# Patient Record
Sex: Male | Born: 1980 | Race: White | Hispanic: No | Marital: Married | Smoking: Never smoker
Health system: Western US, Academic
[De-identification: ages and names within clinical notes are randomized; demographics above are authoritative.]

## PROBLEM LIST (undated history)

## (undated) DIAGNOSIS — Z789 Other specified health status: Secondary | ICD-10-CM

## (undated) HISTORY — PX: ROTATOR CUFF REPAIR: SHX139

## (undated) HISTORY — DX: Other specified health status: Z78.9

---

## 2013-04-10 HISTORY — PX: WRIST SURGERY: SHX841

## 2016-09-08 ENCOUNTER — Encounter (INDEPENDENT_AMBULATORY_CARE_PROVIDER_SITE_OTHER): Payer: Self-pay | Admitting: Sports Medicine

## 2016-09-08 DIAGNOSIS — M25562 Pain in left knee: Principal | ICD-10-CM

## 2016-09-11 ENCOUNTER — Encounter (INDEPENDENT_AMBULATORY_CARE_PROVIDER_SITE_OTHER): Payer: Self-pay | Admitting: Sports Medicine

## 2016-09-11 ENCOUNTER — Ambulatory Visit (INDEPENDENT_AMBULATORY_CARE_PROVIDER_SITE_OTHER): Payer: TRICARE Prime—HMO | Admitting: Sports Medicine

## 2016-09-11 VITALS — BP 142/81 | HR 87 | Temp 98.4°F

## 2016-09-11 DIAGNOSIS — M25562 Pain in left knee: Secondary | ICD-10-CM

## 2016-09-11 DIAGNOSIS — M7652 Patellar tendinitis, left knee: Secondary | ICD-10-CM

## 2016-09-11 NOTE — Progress Notes (Signed)
[facility] DEPARTMENT OF ORTHOPAEDIC SURGERY, SPORTS MEDICINE    PRIMARY CARE PROVIDER:   Gabrielle Dare or REFERRING PHYSICIAN:   Concepcion Living, DO  [address]  [place], [place] [address]    REASON FOR VISIT: Left knee pain    HISTORY: Cameron Ray is a 36 year old male who presents today for evaluation of knee pain, left.  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     He started having left anterior knee pain for the past 6 months.   Pain is located at the inferior pole of patella  He doesn't have any other pain.  After seeing his flight surgeon, he has tried physical therapy 12 wks x 2 and has not improved.   Initially pain was mild but now had gotten to point that interferes with sleep and also he is unable to do any climbing stairs or maximal knee flexion.     He had an MRI 09/08/16, showing lateral patellar facet fissuring and also patellar tendinopathy.     He had normally been an avid runner and has cut back on his activity due to the knee pain.     Past Medical History:   Diagnosis Date    Medical history non-contributory        Past Surgical History:   Procedure Laterality Date    ROTATOR CUFF REPAIR Left 2009, 2012    x 2    WRIST SURGERY Right 2015       No current outpatient prescriptions on file.     No current facility-administered medications for this visit.        Social History     Social History    Marital status: Married     Spouse name: N/A    Number of children: N/A    Years of education: N/A     Occupational History    Not on file.     Social History Main Topics    Smoking status: Never Smoker    Smokeless tobacco: Never Used    Alcohol use Not on file    Drug use: Not on file    Sexual activity: Not on file     Other Topics Concern    Not on file     Social History Narrative    No narrative on file       family history includes Cancer in his other; Diabetes in his other; Stroke in his other.    Allergies not on file    Review of Systems: per Intake  Sheet    PHYSICAL EXAM:  VITALS: BP 142/81   Pulse 87   Temp 98.4 F (36.9 C) (Oral)  GENERAL: A+Ox3. INAD. Well appearing and well groomed. Normal gait  MENTAL STATUS: Pleasant and cooperative. Alert and oriented x3 with normal mood and affect.  Vascular: Pulses are palpable 2+, capillary refills to digits are normal.  Skin: No wounds/lesions/ulcers. Skin warm & dry.  Respiratory: No respiratory distress    MUSCULOSKELETAL:    Left Knee:   Skin: intact  Effusion: none  Range of Motion: 0-140 degrees.  Tenderness: Medial Joint Line: no ;Lateral Joint Line: no. TTP Proximal lateral patellar tendon at inferior pole of patella  Stable to Varus/Valgus stress.    Lachman: Normal.    Posterior Drawer: Normal.   McMurray exam: Negative for pain, negative for click.   Patellar Grind: negative  Patellar Stability: moderate hypermobility  Normal Straight leg  raise without quad atophy  Mildly positive J sign    Muscle Strength: R/L 5/5 in the anterior, posterior, lateral group muscles.  Compartments: are soft, no proximal calf tenderness.   SILT saphenous/sural/superficial peroneal/deep peroneal nerves      ==========    IMAGING FINDINGS: I reviewed the patient's imaging today.  MRI reviewed today in clinic, showing Tendinopathy of proximal patellar tendon on lateral portion. There is some fissuring of lateral patellar facet, otherwise normal MRI left knee.     ==========    ASSESSMENT & PLAN: Cameron PriestJeremy Lee Ray is a 36 year old male:      ICD-10-CM ICD-9-CM    1. Left knee pain, unspecified chronicity M25.562 719.46    2. Patellar tendinitis, left knee M76.52 726.64      Patient presents with recalcitrant anterior knee pain following total of 24 sessions of physical therapy since 6 months ago.    We discussed the findings in detail today as well as treatment options. The patient has had quite a bit of physical therapy already.  I am not sure that this is really making a difference at this point. 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  The other possibility would be consideration of a surgical debridement and repair of the patellar tendon.  Usually would not do this without exhausting all nonsurgical options. Since he is active duty [employer], he is going to go back and talk to his regular doctor to see if they would cover the platelet rich plasma.  He will get back to us.  I told him that if [facility] had the [facility], that would also be reasonable.  He is agreeable to this plan and all questions were answered.    -Patient was educated on clinical and imaging findings.  -Patient verbalizes understanding of all discussions today, and all questions were answered satisfactorily.    I saw the patient, performed a physical exam, and developed the treatment plan.    I edited the note and agree with the content as written.    See PA/resident note for full detail.

## 2016-09-12 ENCOUNTER — Other Ambulatory Visit: Payer: Self-pay

## 2016-09-12 ENCOUNTER — Telehealth (INDEPENDENT_AMBULATORY_CARE_PROVIDER_SITE_OTHER): Payer: Self-pay | Admitting: Sports Medicine

## 2016-09-12 NOTE — Telephone Encounter (Signed)
Patient is calling to request his OV notes from yesterday be faxed to him [phone]. Patient states this is his personal fax for his records. Patient states he was advised to call this morning as notes should be completed/signed.

## 2016-09-12 NOTE — Telephone Encounter (Signed)
Cameron Ray  requested callback for advice on:  Pt is calling in regards to the PRP injections that were offered bu Dr. Merilynn Finlandobertson and he stated Dr wanted him to call his insurance to see if they cover the injections bu they stated that we need to call them and see if it will be covered , please advice Pt if he can get these injections covered and authorized by his [employer] insurance .       Treating Physician:Dr. Merilynn Finlandobertson       LOV notes/plan: 09/11/2016  ASSESSMENT & PLAN: Cameron PriestJeremy Lee Bogert is a 36 year old male:      ICD-10-CM ICD-9-CM    1. Left knee pain, unspecified chronicity M25.562 719.46    2. Patellar tendinitis, left knee M76.52 726.64      Patient presents with recalcitrant anterior knee pain following total of 24 sessions of physical therapy since 6 months ago.    Presentation c/w recalcitrant proximal patellar tendinopathy. Currently symptoms are not due to intra-articular pathology.   -patient is a good candidate for PRP injection onto the diseased portion of the patellar tendon to help with recovery. patient will have to check if this will be covered for his treatment  -physical therapy can be tailored to focus on eccentric strengthening of patellar tendon.   -surgical debridement is also last option as treatment modality.       -Patient was educated on clinical and imaging findings.  -Patient verbalizes understanding of all discussions today, and all questions were answered satisfactorily.    Pennelope BrackenUziel Isidro Sauceda MD    Primary care Sports Medicine Fellow     Patient seen and discussed with Attending Dr. Domenic Morasobertson          [facility] number to be reached at:    [phone]        Route to RN pool if requesting clinical advice, route to MA if administrative advice

## 2016-09-13 NOTE — Telephone Encounter (Signed)
LVM for pt.   Pt should be advised to sign up for mychart access, he can make medical records requests through [facility] or at the medical records office.   Last chart not is not yet signed by Dr Merilynn Finlandobertson.     In regard to PRP, this is very rarely, if ever, a cover service. [facility] does not call pt insurance's for prior authorizations as this is not a covered service. We offer PRP as a cash pay service, current rate $600. Pt could opt to submit his own paperwork for potentially trying to get reimbursement from his insurance. I have not seen this done successfully either.  I suspect Dr Merilynn Finlandobertson was recommending he call [employer] simply as an inquiry for pt edification. PRP injections are not usually covered by any insurance.

## 2016-09-18 ENCOUNTER — Encounter (INDEPENDENT_AMBULATORY_CARE_PROVIDER_SITE_OTHER): Payer: Self-pay | Admitting: Sports Medicine

## 2016-09-19 ENCOUNTER — Telehealth (INDEPENDENT_AMBULATORY_CARE_PROVIDER_SITE_OTHER): Payer: Self-pay | Admitting: Sports Medicine

## 2016-09-19 NOTE — Telephone Encounter (Signed)
Pt will like to know what billing  codes will Dr. Merilynn Finlandobertson use to do PRP to his L knee. Pt is trying to see if he can obtain authorization through his insurance. Please advise pt.

## 2016-09-19 NOTE — Telephone Encounter (Signed)
CPT Code: 0232T     Pt informed.

## 2016-10-16 ENCOUNTER — Telehealth (INDEPENDENT_AMBULATORY_CARE_PROVIDER_SITE_OTHER): Payer: Self-pay | Admitting: Sports Medicine

## 2016-10-16 NOTE — Telephone Encounter (Addendum)
Patient is calling back states he would like to pay oop for the PRP injection need to know exact amount.     Please confirm ok to schedule as CASH.

## 2016-10-16 NOTE — Telephone Encounter (Signed)
Routed to [place] to call pt and schedule

## 2016-10-24 ENCOUNTER — Encounter (INDEPENDENT_AMBULATORY_CARE_PROVIDER_SITE_OTHER): Payer: Self-pay | Admitting: Sports Medicine

## 2016-10-27 ENCOUNTER — Encounter (INDEPENDENT_AMBULATORY_CARE_PROVIDER_SITE_OTHER): Payer: TRICARE Prime—HMO | Admitting: Sports Medicine

## 2016-12-08 ENCOUNTER — Ambulatory Visit (INDEPENDENT_AMBULATORY_CARE_PROVIDER_SITE_OTHER): Payer: Self-pay | Admitting: Sports Medicine

## 2016-12-08 ENCOUNTER — Encounter (INDEPENDENT_AMBULATORY_CARE_PROVIDER_SITE_OTHER): Payer: Self-pay | Admitting: Sports Medicine

## 2016-12-08 VITALS — BP 115/76 | HR 84 | Temp 98.2°F

## 2016-12-08 DIAGNOSIS — M7652 Patellar tendinitis, left knee: Principal | ICD-10-CM

## 2016-12-08 DIAGNOSIS — Z09 Encounter for follow-up examination after completed treatment for conditions other than malignant neoplasm: Secondary | ICD-10-CM

## 2016-12-08 MED ORDER — HYDROCODONE-ACETAMINOPHEN 5-325 MG OR TABS
1.0000 | ORAL_TABLET | Freq: Four times a day (QID) | ORAL | 0 refills | Status: DC | PRN
Start: 2016-12-08 — End: 2017-02-09

## 2016-12-08 NOTE — Progress Notes (Signed)
Reason for visit: Platelet rich plasma injection.    Consent was obtained from the patient.    120 cc of blood was drawn from the arm in sterile fashion. This [facility] cent[facility]uge using the 5% protocol.    The affected area was then prepped in normal sterile fashion. 3cc 1% lidocain given in the skin.  The PRP was then injected into the left patellar tendon using ultrasound guidance.  Images saved.    The patient tolerated the procedure well.    Postprocedure precautions were given.    The patient will give us an update in 6 weeks.      Patient instructions provided today in clinic:    Post PRP activity restrictions:    No exercise for 2 days after PRP injection    1) First 2 days- normal activity, no exercise    2) First 2 weeks- 50% activity, low impact exercise OK    3) Week 3: 75% activity    4) Week 4: normal activity- ok for weights but start light    No NSAIDs x 1 week      I saw the patient, performed a physical exam, and developed the treatment plan.    I edited the note and agree with the content as written.    See PA/resident note for full detail.

## 2016-12-08 NOTE — Patient Instructions (Signed)
Post PRP activity restrictions:    No exercise for 2 days after PRP injection    1) First 2 days- normal activity, no exercise    2) First 2 weeks- 50% activity, low impact exercise OK    3) Week 3: 75% activity    4) Week 4: normal activity- ok for weights but start light    No NSAIDs x 1 week

## 2017-01-16 ENCOUNTER — Telehealth (INDEPENDENT_AMBULATORY_CARE_PROVIDER_SITE_OTHER): Payer: Self-pay | Admitting: Sports Medicine

## 2017-01-16 NOTE — Telephone Encounter (Signed)
Patient states he was told to call in 6 weeks after his PRP to relay results. Per patient he felt improvement but "doesn't feel that great, I can't go run a marathon or anything".  Patient is wondering if Dr. Merilynn Finland has a recommendation for the next step.

## 2017-01-19 NOTE — Telephone Encounter (Signed)
The patient is now 6 weeks post PRP.  He is a bit better now than prior to injection.  He is doing PT.    He is better but not fully resolved.  Discussed options.    WIll recheck in 2-3 weeks.

## 2017-02-06 ENCOUNTER — Ambulatory Visit (INDEPENDENT_AMBULATORY_CARE_PROVIDER_SITE_OTHER): Payer: TRICARE Prime—HMO | Admitting: Sports Medicine

## 2017-02-06 VITALS — BP 121/79 | HR 93 | Temp 98.2°F | Ht 68.0 in | Wt 173.0 lb

## 2017-02-06 DIAGNOSIS — M7652 Patellar tendinitis, left knee: Principal | ICD-10-CM

## 2017-02-06 NOTE — Progress Notes (Signed)
[facility] DEPARTMENT OF ORTHOPAEDIC SURGERY, SPORTS MEDICINE    PRIMARY CARE PROVIDER:   Gabrielle Dare or REFERRING PHYSICIAN:   Concepcion Living, DO  [address]  [place], [place] [address]    REASON FOR VISIT: Left knee pain    HISTORY: Cameron Ray is a 36 year old male who presents today for evaluation of knee pain, left.  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     He has been under treatment for left knee lateral patellar tendinitis.  He underwent a PRP injection on 08/31.  He has had moderate improvement of symptoms since that time.  He notes that he has more good days than bad but is still not able to run.  He is no longer getting pain that wakes him up from sleep.  He has less tenderness in the area.  He has worked with a new physical therapist which she is also felt was very helpful.  He is working on strengthening his VMO and also on some lateral maltracking.      Past Medical History:   Diagnosis Date    Medical history non-contributory        Past Surgical History:   Procedure Laterality Date    ROTATOR CUFF REPAIR Left 2009, 2012    x 2    WRIST SURGERY Right 2015       Current Outpatient Prescriptions   Medication Sig    HYDROcodone-acetaminophen (NORCO) 5-325 MG tablet Take 1 tablet by mouth every 6 hours as needed for Moderate Pain (Pain Score 4-6).     No current facility-administered medications for this visit.        Social History     Social History    Marital status: Married     Spouse name: N/A    Number of children: N/A    Years of education: N/A     Occupational History    Not on file.     Social History Main Topics    Smoking status: Never Smoker    Smokeless tobacco: Never Used    Alcohol use Not on file    Drug use: Not on file    Sexual activity: Yes     Partners: Female     Other Topics Concern    Not on file     Social History Narrative    Active duty Curator for [employer]       family history includes Cancer in his other; Diabetes in  his other; Stroke in his other.    No Known Allergies    Review of Systems: per Intake Sheet    PHYSICAL EXAM:  VITALS: BP 121/79   Pulse 93   Temp 98.2 F (36.8 C)   Ht 5\' 8"  (1.727 m)   Wt 78.5 kg (173 lb)   BMI 26.3 kg/m2  GENERAL: A+Ox3. INAD. Well appearing and well groomed. Normal gait  MENTAL STATUS: Pleasant and cooperative. Alert and oriented x3 with normal mood and affect.  Vascular: Pulses are palpable 2+, capillary refills to digits are normal.  Skin: No wounds/lesions/ulcers. Skin warm & dry.  Respiratory: No respiratory distress    MUSCULOSKELETAL:    Left Knee:   Skin: intact  Effusion: none  Range of Motion: 0-140 degrees.  Tenderness: Medial Joint Line: no ;Lateral Joint Line: Mild TTP Proximal lateral patellar tendon at inferior pole of patella  Stable to Varus/Valgus stress.    Lachman: Normal.  Posterior Drawer: Normal.   McMurray exam: Negative for pain, negative for click.   Patellar Grind: negative  Patellar Stability: moderate hypermobility  Normal Straight leg raise with mild VMO the quad atophy  Mildly positive J sign    Muscle Strength: R/L 5/5 in the anterior, posterior, lateral group muscles.  Compartments: are soft, no proximal calf tenderness.   SILT saphenous/sural/superficial peroneal/deep peroneal nerves      ==========    IMAGING FINDINGS: I reviewed the patient's imaging today.  MRI reviewed today in clinic, showing Tendinopathy of proximal patellar tendon on lateral portion. There is some fissuring of lateral patellar facet, otherwise normal MRI left knee.     A new ultrasound was obtained today with images saved.  He has improved parents of the patellar tendon laterally.    ==========    ASSESSMENT & PLAN: Cameron PriestJeremy Lee Ray is a 36 year old male presenting with left knee patellar tendinitis.  He is doing better since his PRP injection. I think overall his exam and symptoms are improved.  We discussed a couple of treatment options. He would like to proceed with the 2nd PRP  injection which we will plan to do this week.  He will also continue physical therapy.    -Patient was educated on clinical and imaging findings.  -Patient verbalizes understanding of all discussions today, and all questions were answered satisfactorily.

## 2017-02-06 NOTE — Patient Instructions (Signed)
PRP Friday midday or afternoon

## 2017-02-09 ENCOUNTER — Ambulatory Visit (INDEPENDENT_AMBULATORY_CARE_PROVIDER_SITE_OTHER): Payer: Self-pay | Admitting: Sports Medicine

## 2017-02-09 VITALS — BP 116/77 | HR 85 | Temp 98.6°F

## 2017-02-09 DIAGNOSIS — Z09 Encounter for follow-up examination after completed treatment for conditions other than malignant neoplasm: Secondary | ICD-10-CM

## 2017-02-09 DIAGNOSIS — M25569 Pain in unspecified knee: Principal | ICD-10-CM

## 2017-02-09 MED ORDER — HYDROCODONE-ACETAMINOPHEN 5-325 MG OR TABS
1.0000 | ORAL_TABLET | Freq: Four times a day (QID) | ORAL | 0 refills | Status: AC | PRN
Start: 2017-02-09 — End: ?

## 2017-02-10 NOTE — Progress Notes (Signed)
Reason for visit: Platelet rich plasma injection.    Consent was obtained from the patient.    120 cc of blood was drawn from the arm in sterile fashion.  This was loaded into the centrifuge on Angel 7% protocol.    The joint was then prepped in normal sterile fashion.  The PRP was then injected into the Left patellar tendon using ultrasound guidance.    The patient tolerated the procedure well.    Postprocedure precautions were given.    The patient will give us an update in 6 weeks.

## 2019-04-24 ENCOUNTER — Encounter: Payer: Self-pay | Admitting: Hospital

## 2019-07-13 ENCOUNTER — Encounter (INDEPENDENT_AMBULATORY_CARE_PROVIDER_SITE_OTHER): Payer: Self-pay

## 2021-05-10 IMAGING — MR MRI WRIST RT WO CONTRAST
6 series · 40 of 40 positions shown · IV contrast (gadolinium)
Comparison: None available.

HISTORY: Right wrist pain
TECHNIQUE: Multiplanar and multisequence MR imaging of the right wrist was performed without the administration of intravenous gadolinium.

[Series 4: t1_axial · axial · right · 3.0mm · 0.26mm/px · z∈[-55,+12]mm · 7 of 22 slices shown]
[im 1/22]
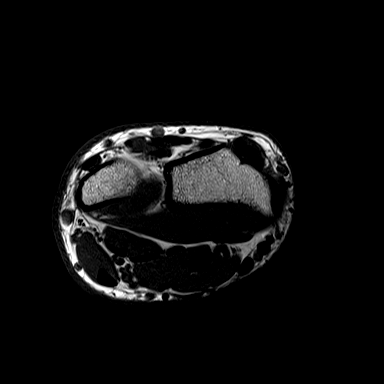
[im 4/22]
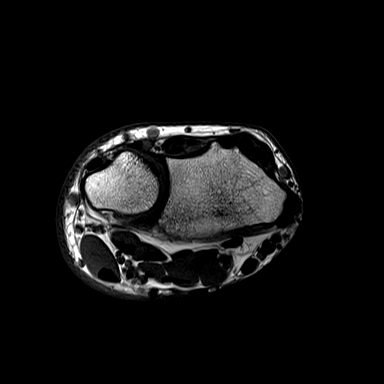
[im 8/22]
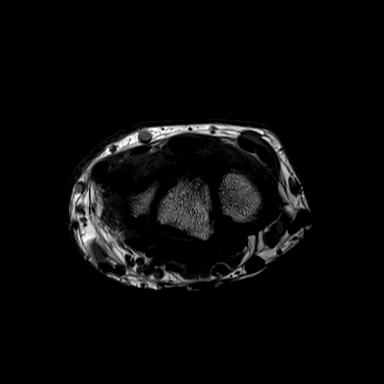
[im 11/22]
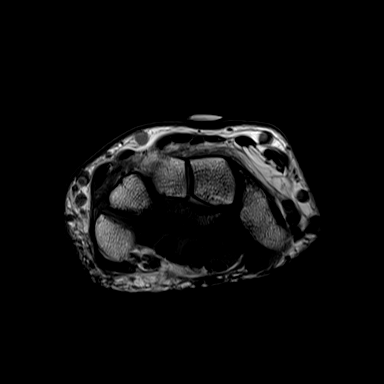
[im 15/22]
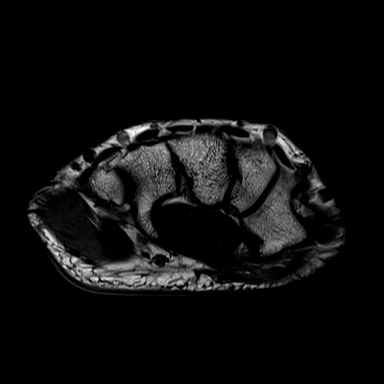
[im 18/22]
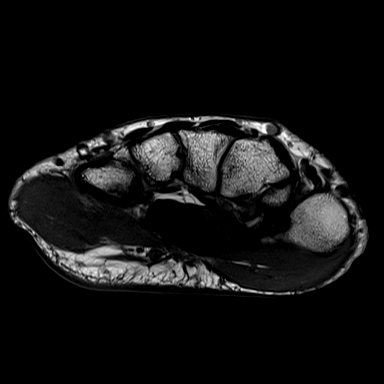
[im 22/22]
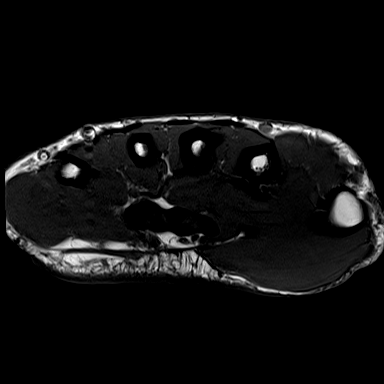

[Series 5: t2_axial_fs · axial · right · 3.0mm · 0.31mm/px · z∈[-55,+12]mm · 6 of 22 slices shown]
[im 1/22]
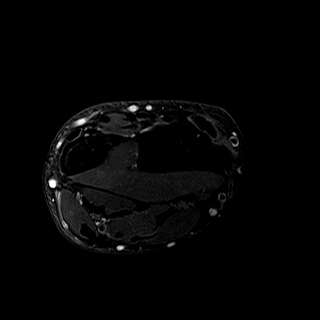
[im 5/22]
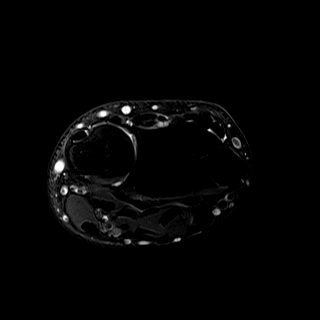
[im 9/22]
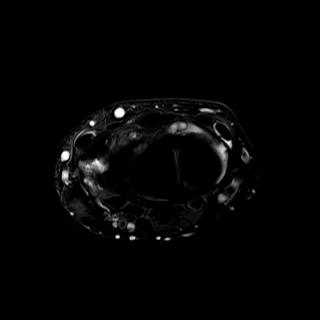
[im 13/22]
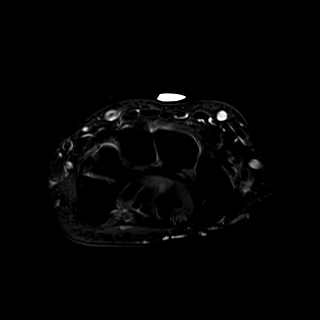
[im 17/22]
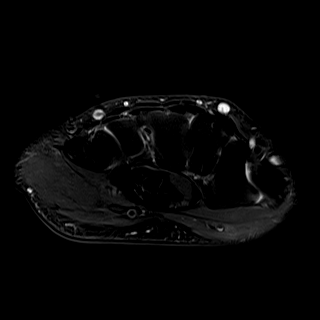
[im 22/22]
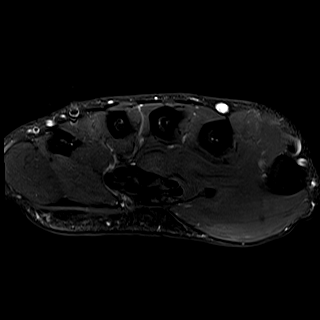

[Series 6: t1_cor · coronal · right · 3.0mm · 0.26mm/px · 4 of 14 slices shown]
[im 1/14]
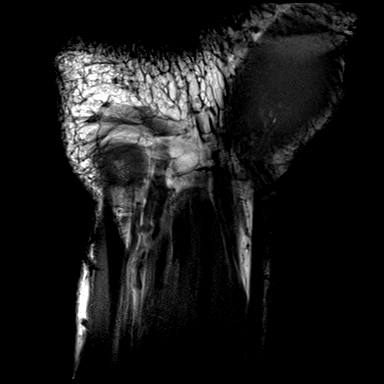
[im 5/14]
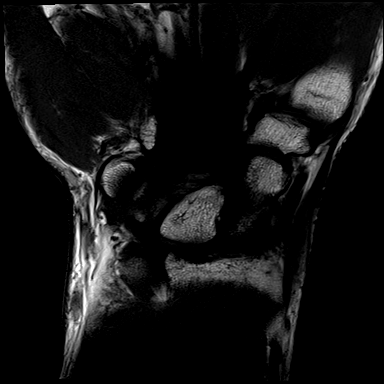
[im 9/14]
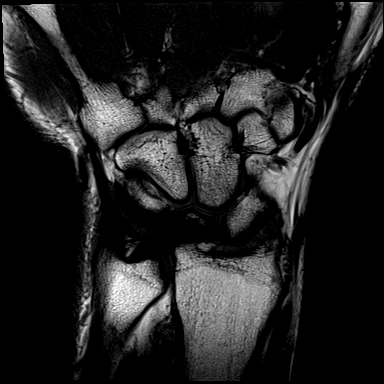
[im 14/14]
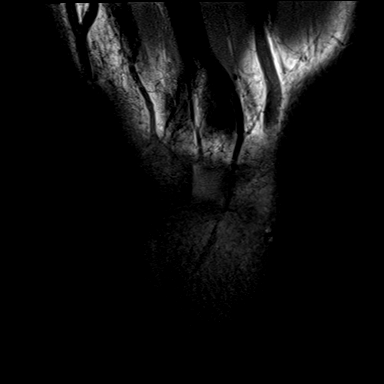

[Series 7: t2_cor_fs · coronal · right · 3.0mm · 0.31mm/px · 4 of 14 slices shown]
[im 1/14]
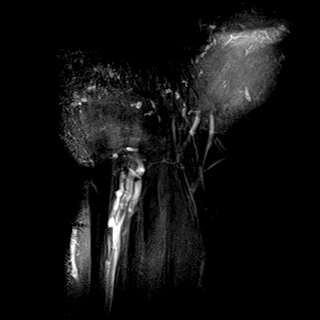
[im 5/14]
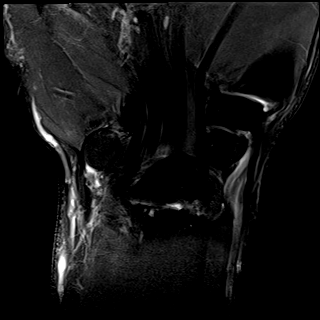
[im 9/14]
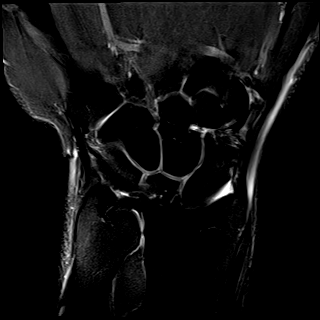
[im 14/14]
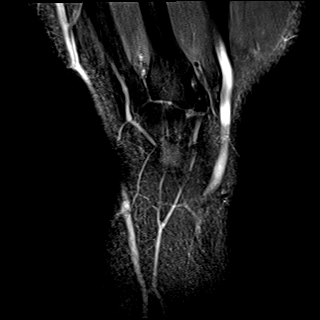

[Series 8: t2_cor_fs_space · coronal · right · 0.9mm · 0.33mm/px · 13 of 44 slices shown]
[im 1/44]
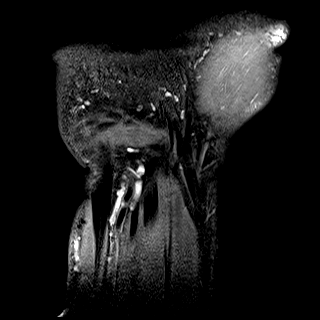
[im 4/44]
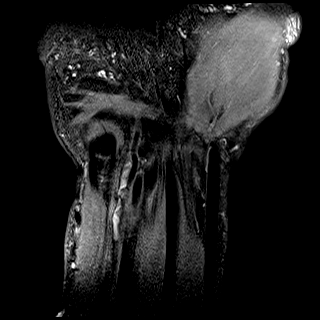
[im 8/44]
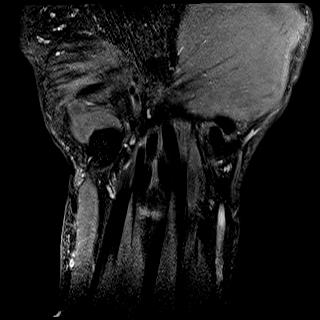
[im 11/44]
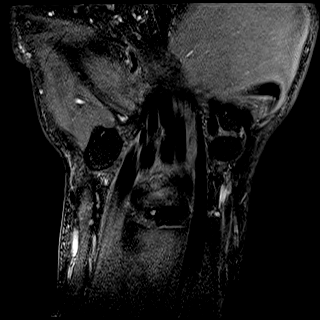
[im 15/44]
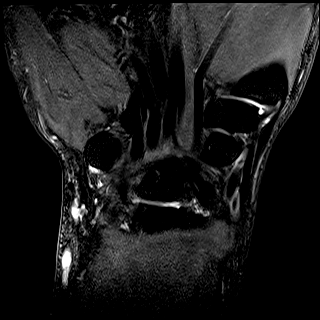
[im 18/44]
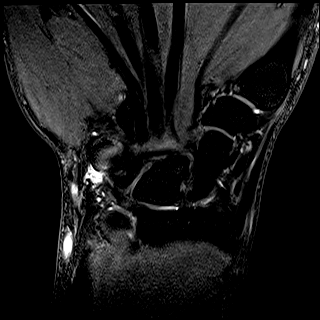
[im 22/44]
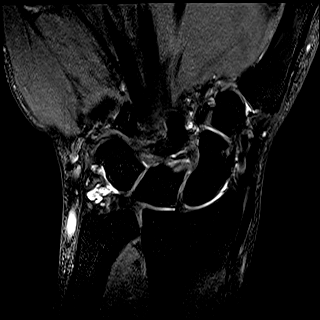
[im 26/44]
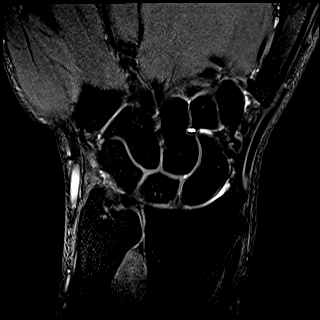
[im 29/44]
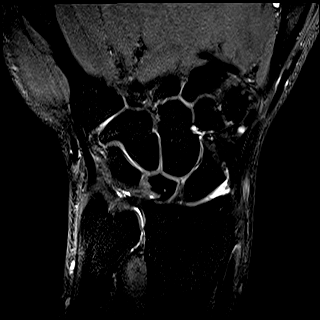
[im 33/44]
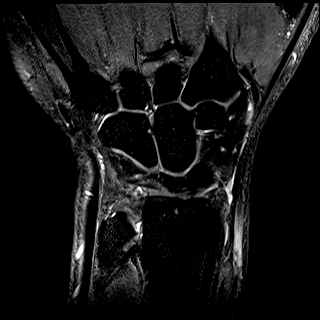
[im 36/44]
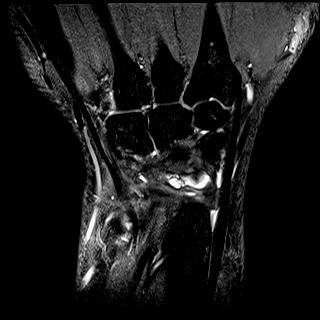
[im 40/44]
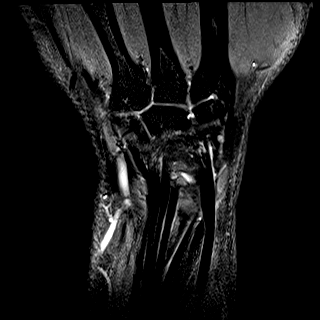
[im 44/44]
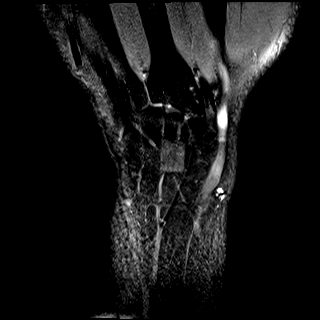

[Series 9: t2_sag_fs · sagittal · right · 3.0mm · 0.39mm/px · 6 of 20 slices shown]
[im 1/20]
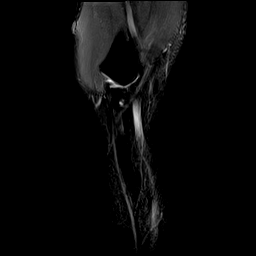
[im 4/20]
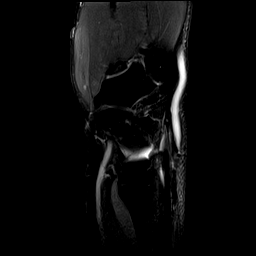
[im 8/20]
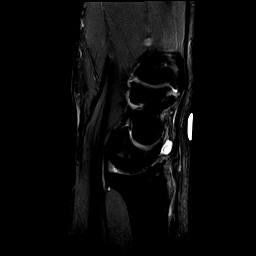
[im 12/20]
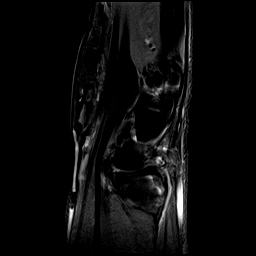
[im 16/20]
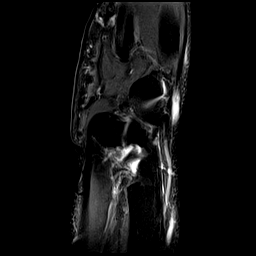
[im 20/20]
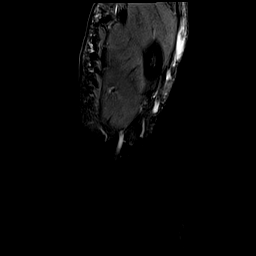

[40 of 40 positions shown; findings below may reference images not displayed]

FINDINGS: Osseous: No acute fracture, avascular necrosis or aggressive osseous lesion. Mild first CMC joint osteoarthritis. Mild radiocarpal joint osteoarthritis, with reactive subchondral marrow edema along the volar aspect of the distal radius at the radial lunate articulation.

Wrist ligaments: The scapholunate and lunotriquetral ligaments are intact. The volar and dorsal extrinsic wrist ligaments are grossly unremarkable.

Triangular fibrocartilage: Intact.

Tendons: The flexor and extensor tendons are intact. No tenosynovitis.

Other: A 15 x 6 mm ganglion cyst is noted along the volar aspect of the wrist. The median nerve is normal in size and signal intensity.
IMPRESSION: 1.
No acute fracture of the right wrist.

2.
Mild radiocarpal and first CMC joint osteoarthritis.

3.
15 x 6 mm ganglion cyst along the volar aspect of the wrist.

## 2021-12-06 IMAGING — RF ARTHROGRAM HIP LT
3 series · 3 of 3 positions shown · non-contrast
Comparison: Left hip MR arthrogram exam from the same day.

Images Obtained from [facility]
HISTORY: Joint derangement/degenerative changes of left hip.

[Series 1: AP · left · 0.15mm/px · 1 of 1 slices shown]
[im 1/1]
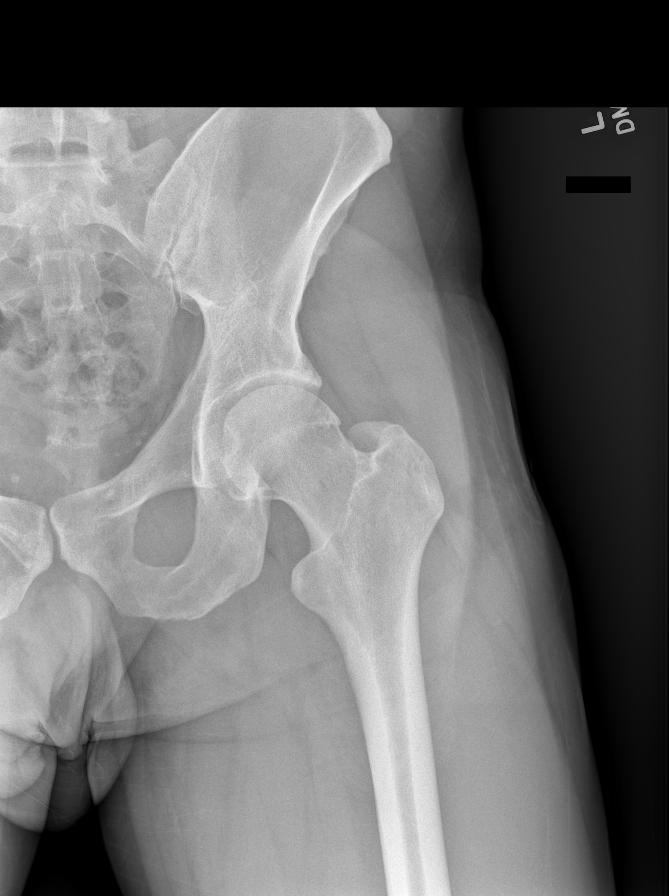

[Series 2: llo · left · 0.15mm/px · 1 of 1 slices shown]
[im 1/1]
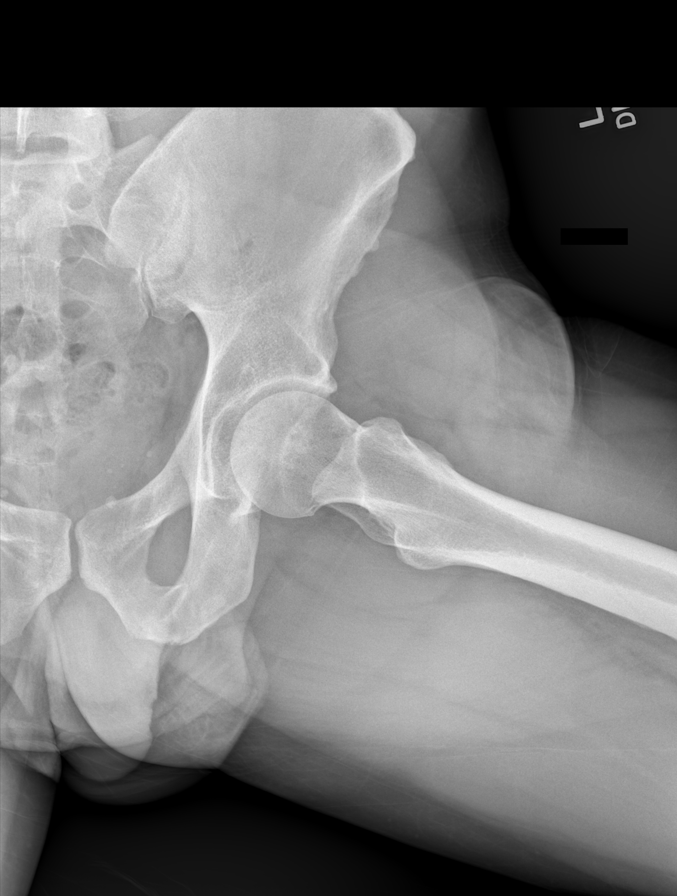

[Series 3: cp_standard · 0.27mm/px · 1 of 1 slices shown]
[im 1/1]
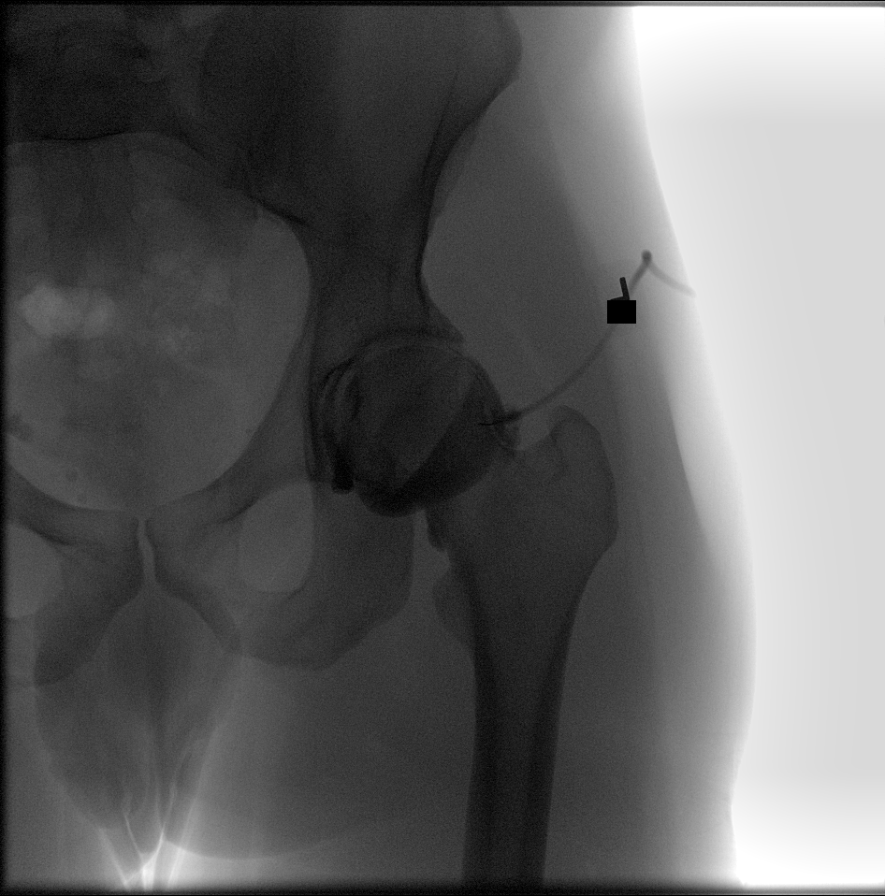

[3 of 3 positions shown; findings below may reference images not displayed]

PROCEDURE:  Left hip arthrogram with intra-articular injection of contrast under fluoroscopic guidance.
RADIATION DOSE:
Number of fluoroscopic images:  3
Fluoroscopy time: 13 seconds
Dose area product: 0.126 mGy*m2
Reference air kerma: 4.70 mGy
DESCRIPTION OF PROCEDURE:  Risks and benefits of left hip MR arthrogram evaluation discussed with the patient. Patient understands risks and benefits of procedure. Informed consent obtained. With
fluoroscopic guidance, sterile technique, local lidocaine anesthesia and 3.5-inch, 22-gauge spinal needle, left hip joint accessed with anterior approach with tip of needle placement, lateral aspect
of left femoral neck. After verification of needle placement with a small amount of contrast, 15 mL of contrast mixture made of 10 mL normal saline, 5 mL 1% lidocaine, 5 mL Omnipaque 240 and 0.1 mL
ProHance injected under fluoroscopic monitoring.
FINDINGS: Two scout images of left hip x-rays demonstrate mild to moderate degenerative changes of left hip joint. Mild degenerative changes of left SI joint and pubic symphysis are seen. No
fracture, dislocation or subluxation is identified. No obvious femoroacetabular impingement of left hip joint is seen. Multiple phleboliths of pelvis are seen bilaterally.
There is successful intra-articular injection of contrast.
IMPRESSION: 1.  Degenerative changes of left hip joint and pelvis as noted.
2.  No obvious femoroacetabular impingement is seen.
3.  There is successful intra-articular injection of dilute gadolinium contrast.
4.  Please also refer to same date left hip MR arthrogram evaluation.

## 2021-12-06 IMAGING — MR MRI HIP LT W CONTRAST
6 series · 40 of 40 positions shown · IV contrast (prohance)
Comparison: Left hip arthrogram 12/06/2021.

Images Obtained from [facility]
INDICATION: Joint derangement of left hip.
TECHNIQUE: Multiplanar, multiecho imaging of the left hip was performed  following 15 mL Prohance intravenous contrast, including T1-weighted and fluid-sensitive sequences.

[Series 2: t1_cor_obl_lt · coronal · left · 3.0mm · 0.56mm/px · 5 of 22 slices shown]
[im 1/22]
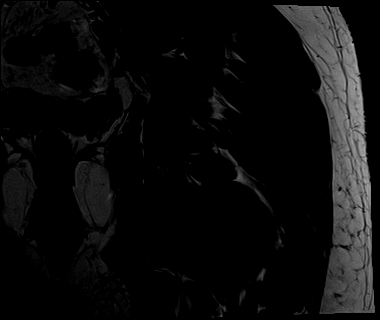
[im 6/22]
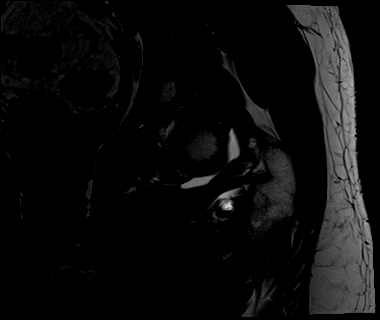
[im 11/22]
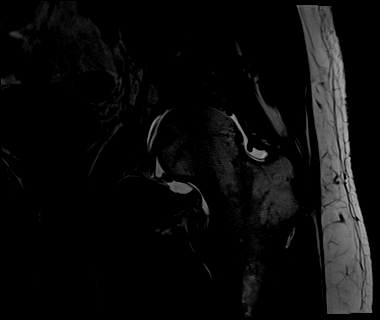
[im 16/22]
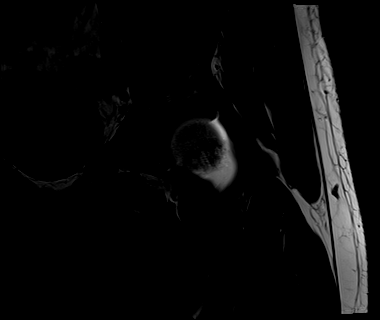
[im 22/22]
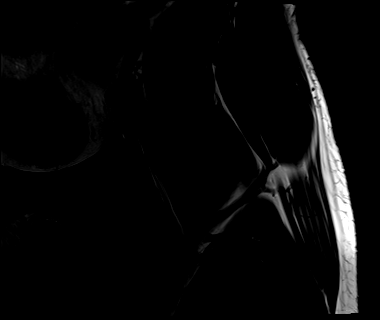

[Series 3: pd_cor_obl_fs_lt · coronal · left · 3.0mm · 0.56mm/px · 5 of 22 slices shown]
[im 1/22]
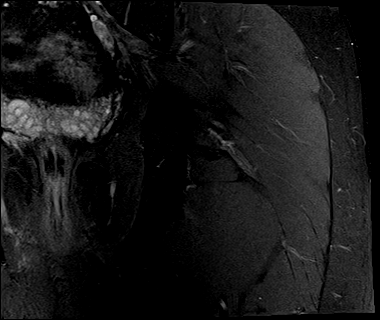
[im 6/22]
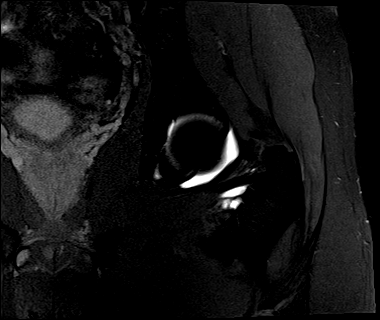
[im 11/22]
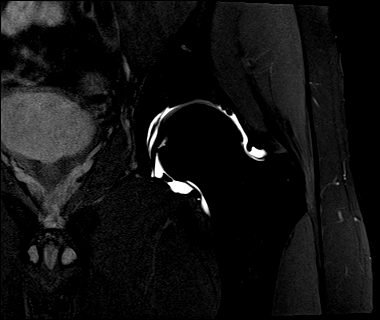
[im 16/22]
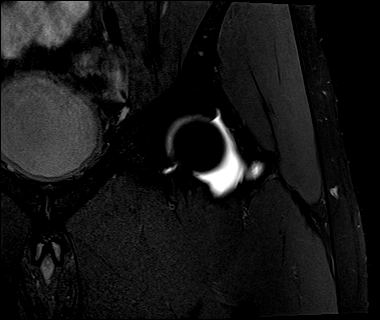
[im 22/22]
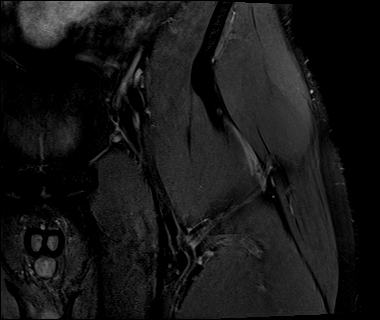

[Series 4: t1_cor_obl_fs_lt · coronal · left · 3.0mm · 0.56mm/px · 6 of 22 slices shown]
[im 1/22]
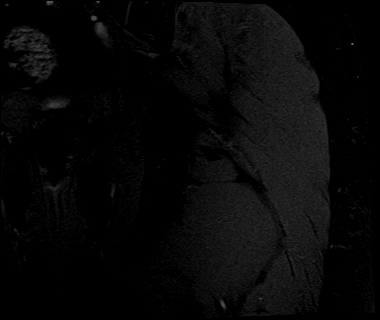
[im 5/22]
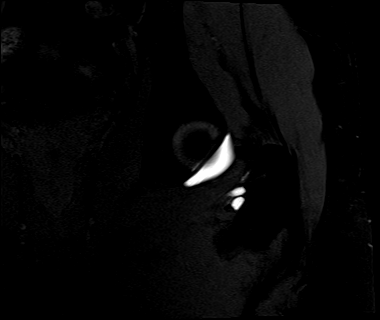
[im 9/22]
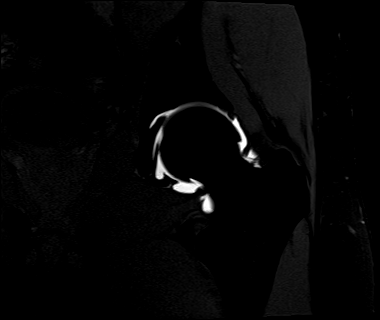
[im 13/22]
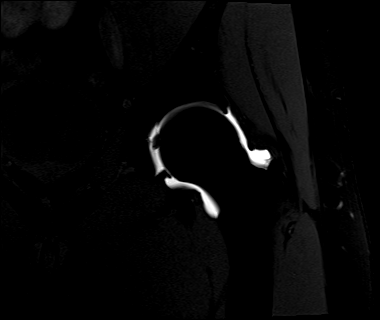
[im 17/22]
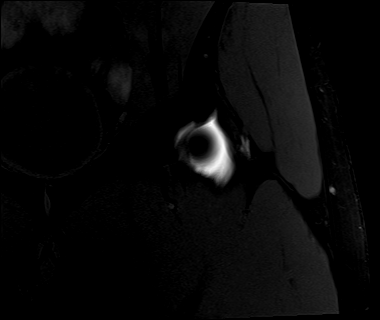
[im 22/22]
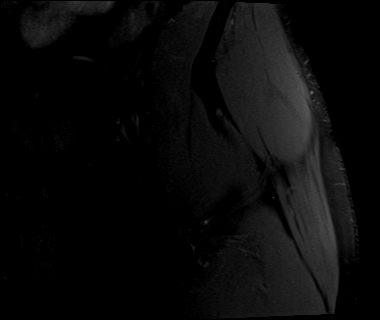

[Series 5: t2_axial_fs_ lt · axial · left · 4.0mm · 0.62mm/px · z∈[-118,+97]mm · 11 of 44 slices shown]
[im 1/44]
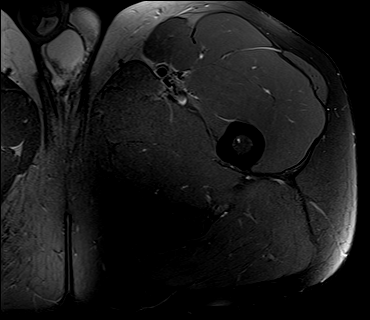
[im 5/44]
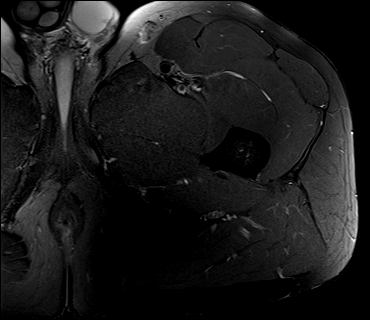
[im 9/44]
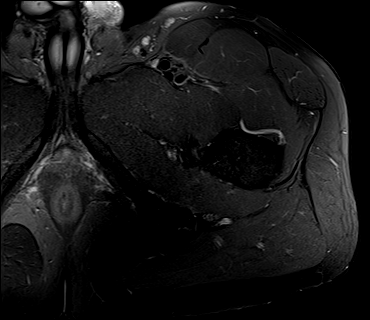
[im 13/44]
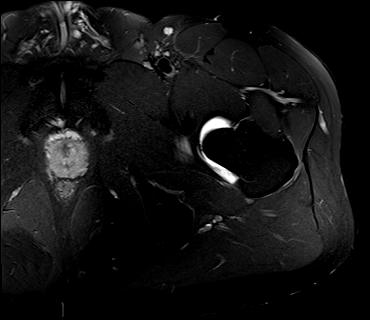
[im 18/44]
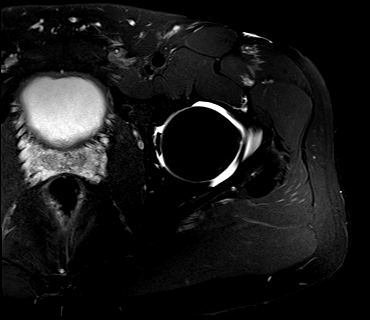
[im 22/44]
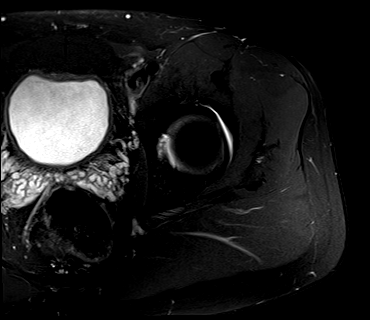
[im 26/44]
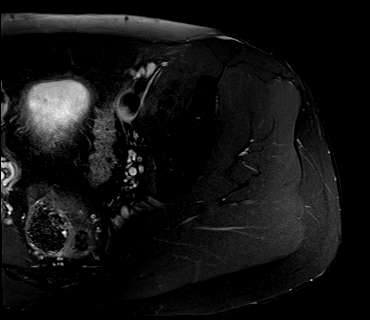
[im 31/44]
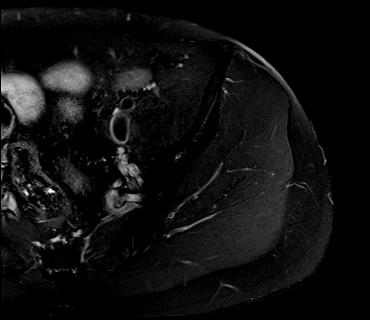
[im 35/44]
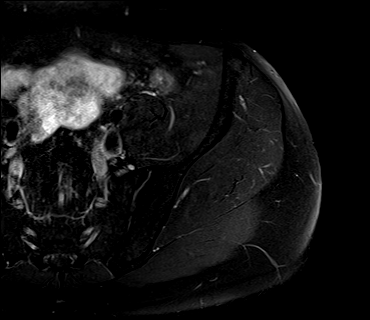
[im 39/44]
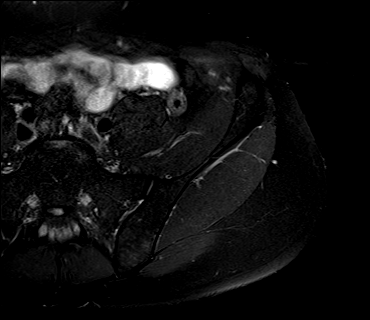
[im 44/44]
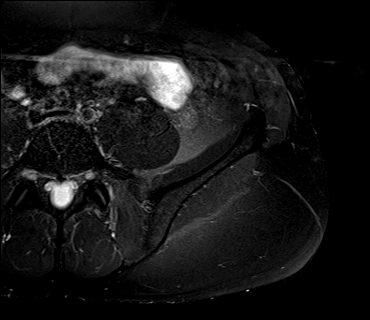

[Series 6: pd_axial_obl_fs_lt · oblique · left · 3.0mm · 0.56mm/px · 6 of 22 slices shown]
[im 1/22]
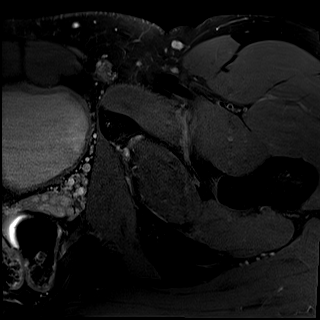
[im 5/22]
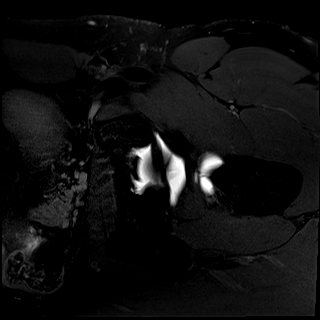
[im 9/22]
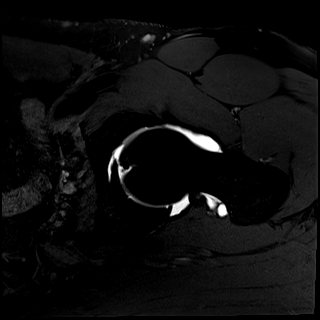
[im 13/22]
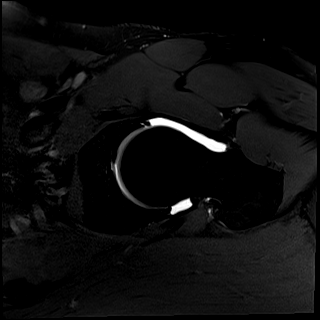
[im 17/22]
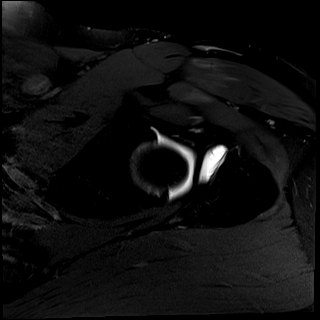
[im 22/22]
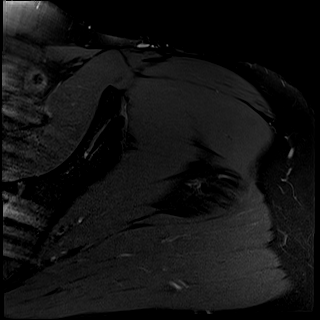

[Series 7: pd_sag_obl_fs_lt · sagittal · left · 3.0mm · 0.56mm/px · 7 of 28 slices shown]
[im 1/28]
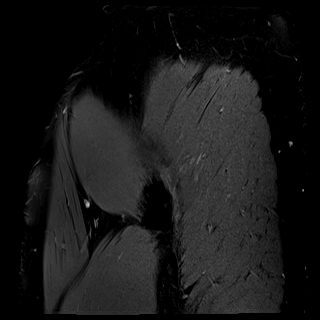
[im 5/28]
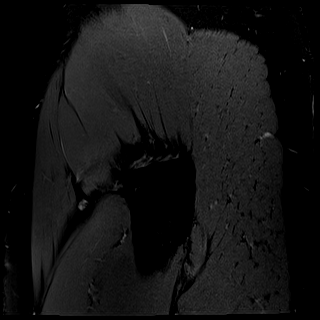
[im 10/28]
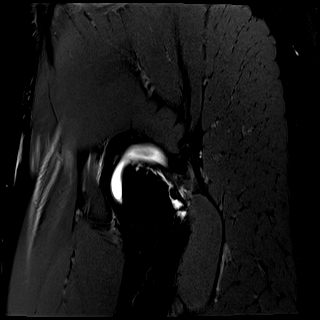
[im 14/28]
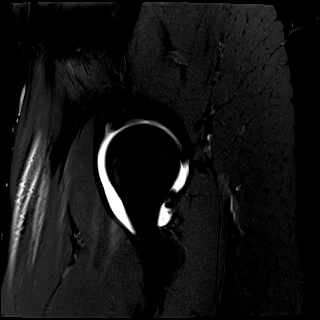
[im 19/28]
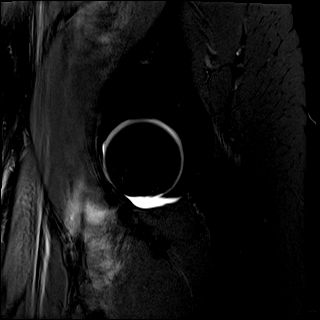
[im 23/28]
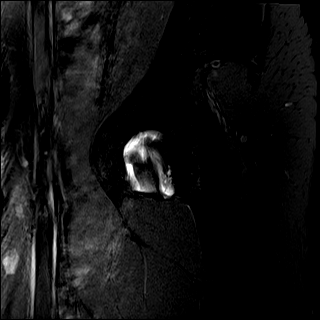
[im 28/28]
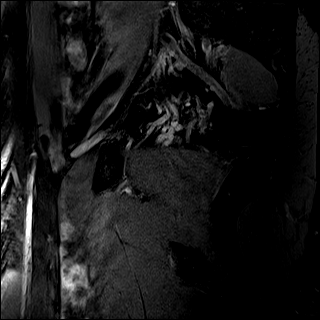

[40 of 40 positions shown; findings below may reference images not displayed]

FINDINGS: Marrow: No acute fracture. No AVN. No erosion.
Visualized SI joints and pubic symphysis: Normal.
Hip joints: No loose body. No chondral defects. Small tear of the anterior labrum. Cam morphology of the proximal left femur. Overcoverage of the femoral head by the acetabulum with a center edge
angle of 41 degrees. Ligamentum teres is intact.
Tendons: Normal.
Soft tissues: Normal.
No left inguinal adenopathy or hernia.
IMPRESSION: 1.  Small tear of the anterior left hip labrum.
2.  No left hip chondral defects.
3.  [place] morphology of the left hip.

## 2021-12-06 IMAGING — MR MRI KNEE LT WO CONTRAST
4 of 5 series · 19 of 40 positions shown · non-contrast
Comparison: None.

Images Obtained from [facility]
INDICATION: Chondromalacia patellae, left knee pain
TECHNIQUE: Multiplanar, multiecho imaging of the left knee was performed, including T1-weighted and fluid sensitive sequences without intravenous contrast administration.

[Series 8: t2_axial_fs · axial · left · 3.0mm · 0.28mm/px · z∈[-46,+76]mm · 8 of 32 slices shown]
[im 1/32]
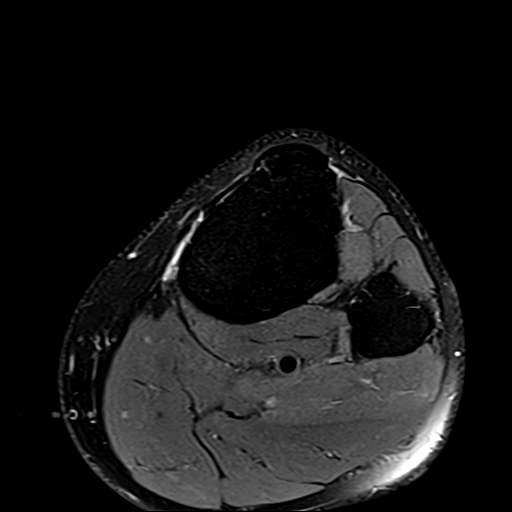
[im 5/32]
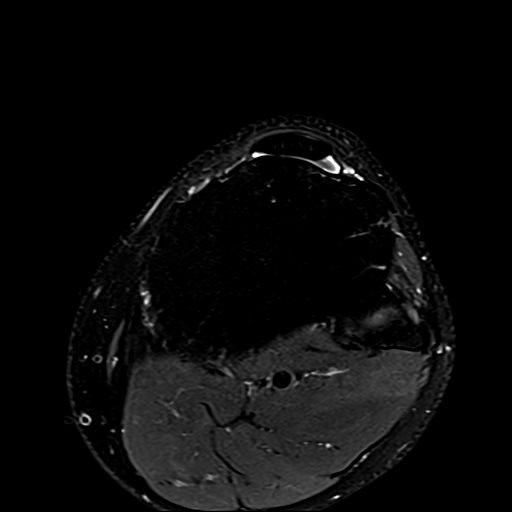
[im 9/32]
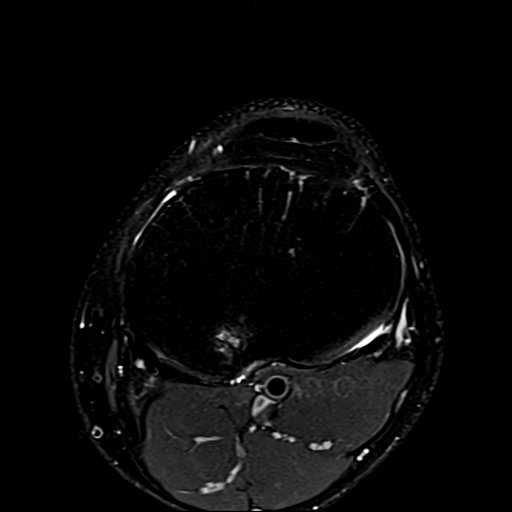
[im 14/32]
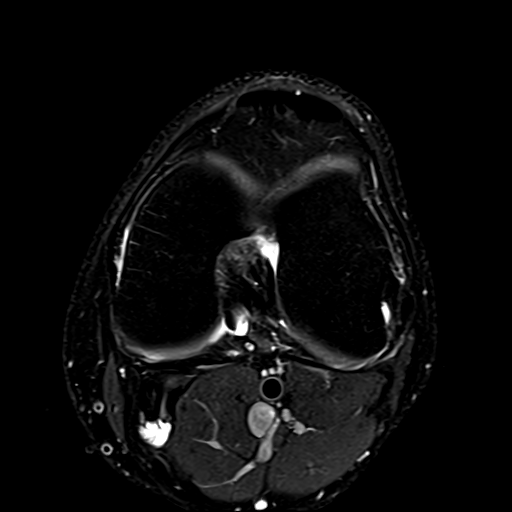
[im 18/32]
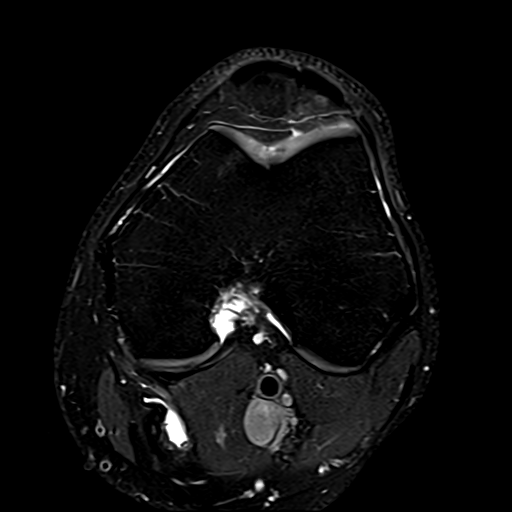
[im 23/32]
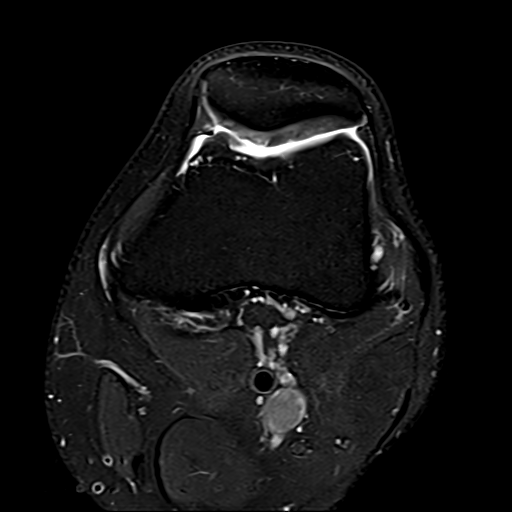
[im 27/32]
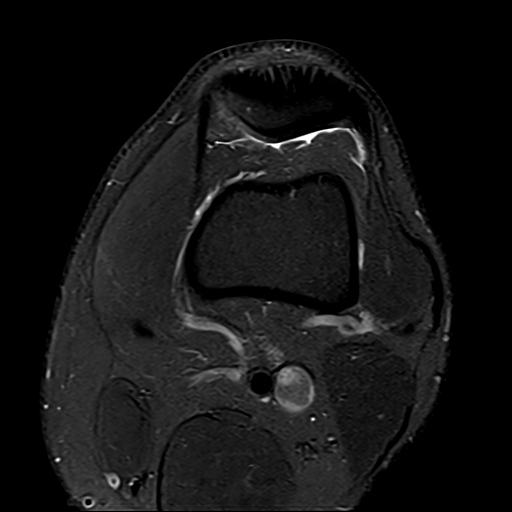
[im 32/32]
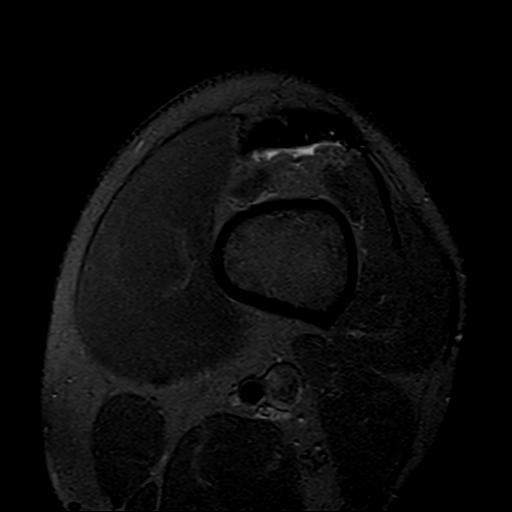

[Series 9: pd_sag_fs · sagittal · left · 3.0mm · 0.28mm/px · 5 of 26 slices shown]
[im 1/26]
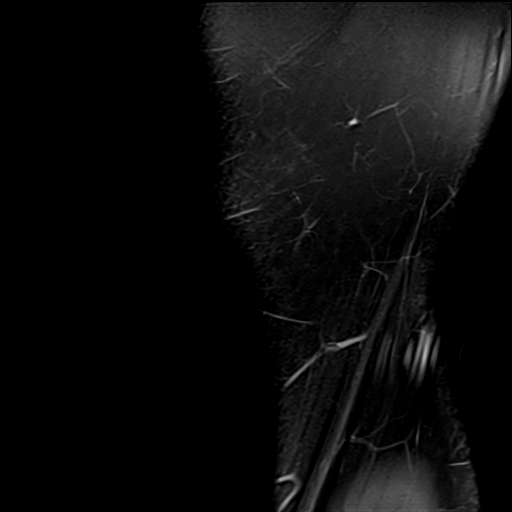
[im 4/26]
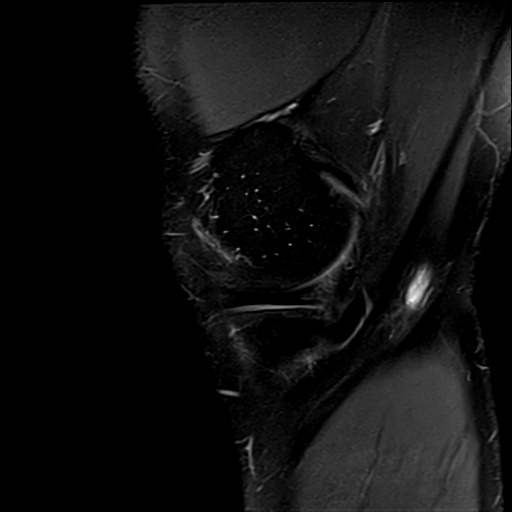
[im 8/26]
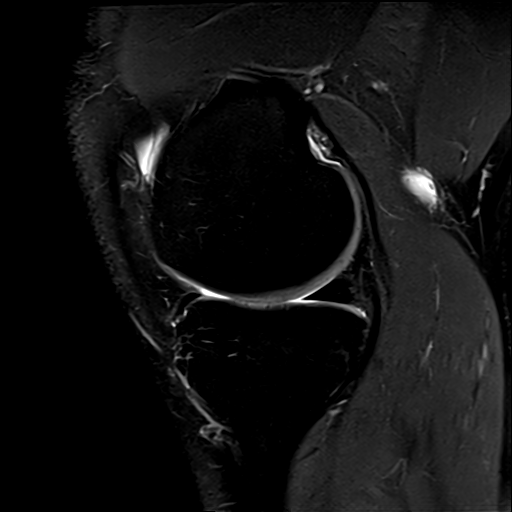
[im 15/26]
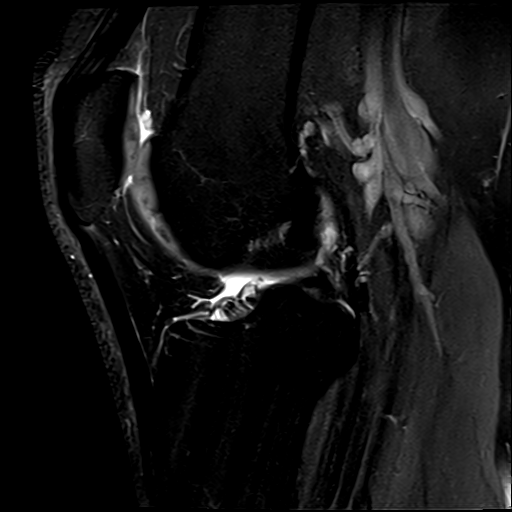
[im 22/26]
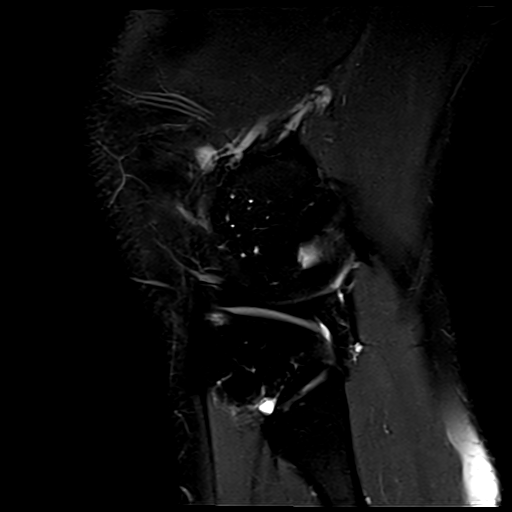

[Series 10: t2_sag_fs · sagittal · left · 3.0mm · 0.28mm/px · 3 of 26 slices shown]
[im 4/26]
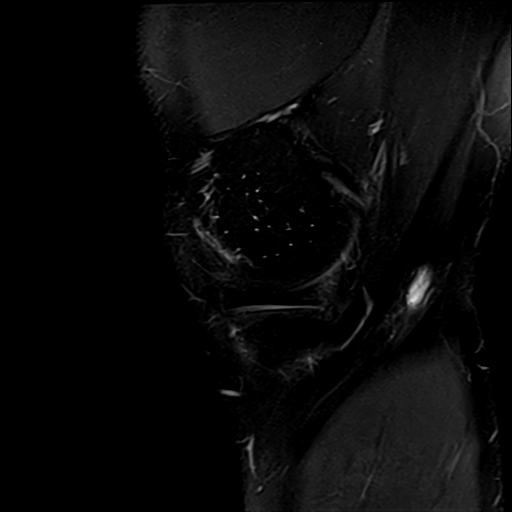
[im 15/26]
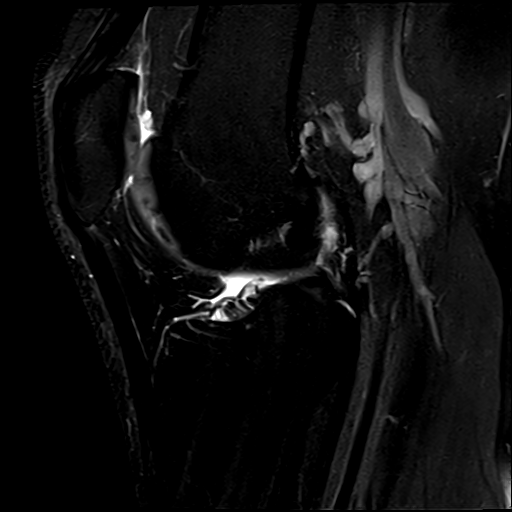
[im 22/26]
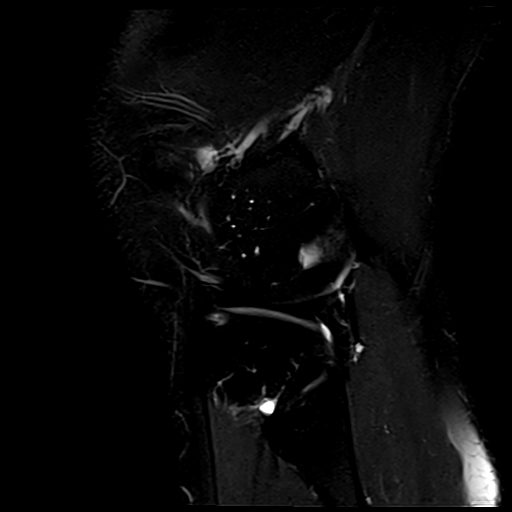

[Series 11: t1_cor · coronal · left · 3.5mm · 0.27mm/px · 3 of 26 slices shown]
[im 4/26]
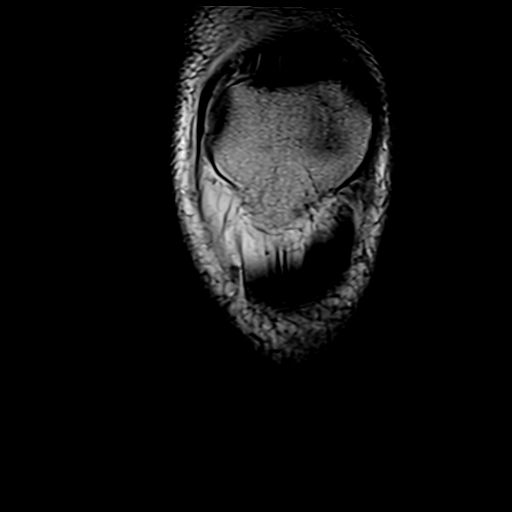
[im 15/26]
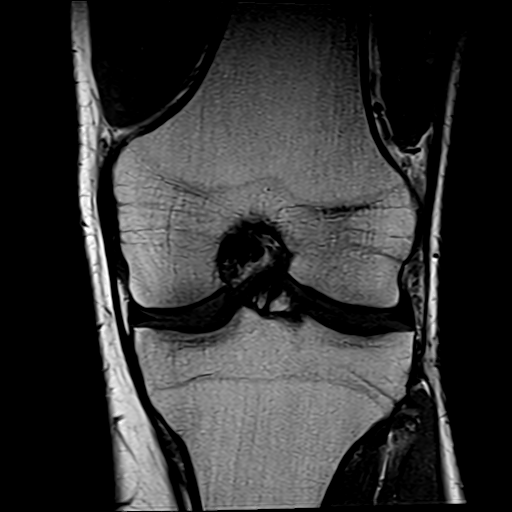
[im 22/26]
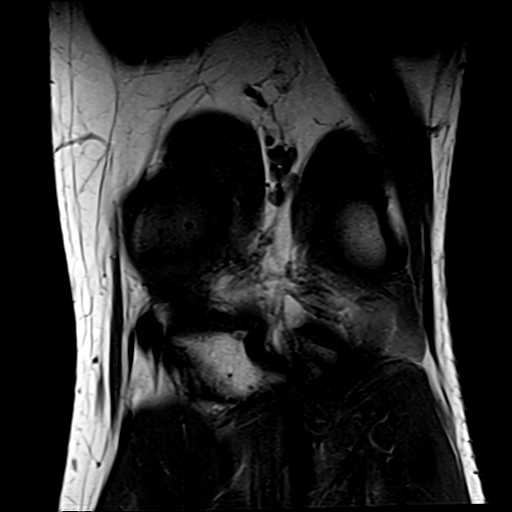

[19 of 40 positions shown; findings below may reference images not displayed]

FINDINGS: MEDIAL MENISCUS:  Intact.
LATERAL MENISCUS:  Intact.
ACL:  Intact.
PCL:  Intact.
MCL:  Intact.
LATERAL LIGAMENTS AND TENDONS:  Intact.
EXTENSOR MECHANISM:  Mild stenosis at the proximal patellar tendon. No tear.
FAT PADS:   Quadriceps tendon is intact.
CARTILAGE:
Patellofemoral compartment:  Extensive low-grade chondral loss at the central femoral trochlea. Scattered low and high-grade chondral defects throughout the patella.
Medial compartment:  Intact.
Lateral compartment: Intact.
BONE MARROW: Normal.
Small Baker's cyst. No joint effusion.
IMPRESSION: 1.  Moderate patellofemoral compartment chondral abnormalities.
2.  Mild tendinosis of the proximal patellar tendon.
3.  Intact ligaments and menisci.

## 2022-06-12 IMAGING — MR MRI CSPINE WO CONTRAST
6 series · 40 of 48 positions shown · non-contrast
Comparison: None

Images Obtained from [facility]
INDICATION: cervicalgia.
TECHNIQUE: Multiplanar, multiecho imaging of the cervical spine was performed, including T1-weighted and fluid sensitive sequences without Intravenous contrast.

[Series 1: bSSFP · axial · 10.0mm · 1.17mm/px · z∈[-60,+153]mm · 6 of 14 slices shown]
[im 1/14]
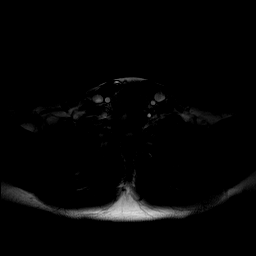
[im 3/14]
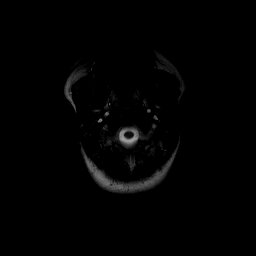
[im 6/14]
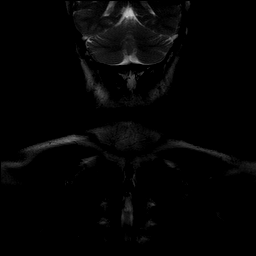
[im 8/14]
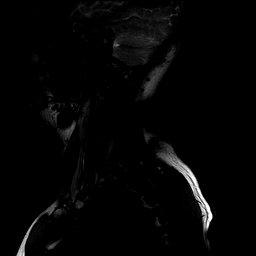
[im 11/14]
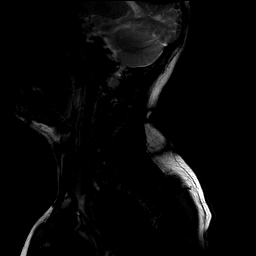
[im 14/14]
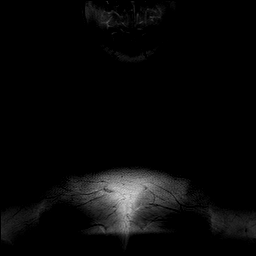

[Series 2: t2_sag · sagittal · 3.0mm · 0.72mm/px · 6 of 15 slices shown]
[im 1/15]
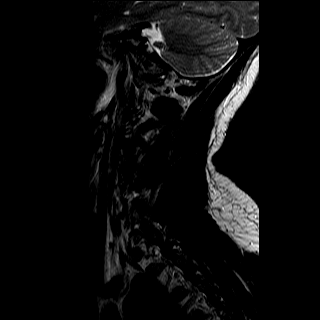
[im 3/15]
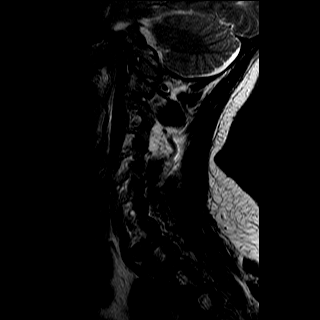
[im 6/15]
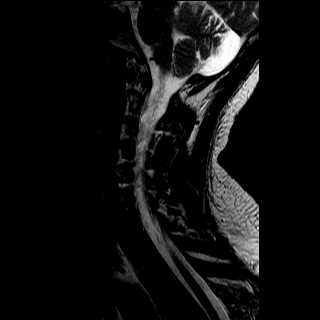
[im 9/15]
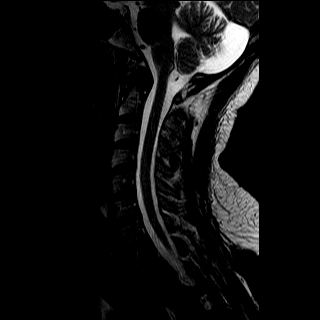
[im 12/15]
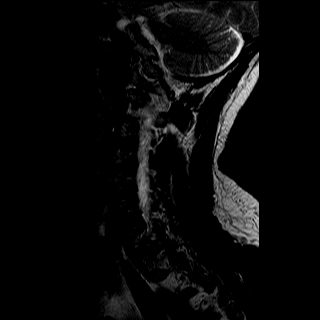
[im 15/15]
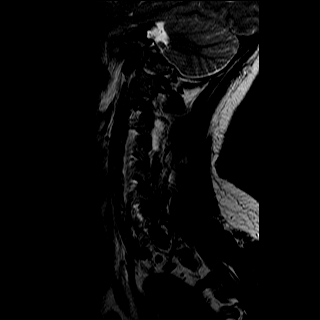

[Series 3: t1_sag · sagittal · 3.0mm · 0.90mm/px · 6 of 15 slices shown]
[im 1/15]
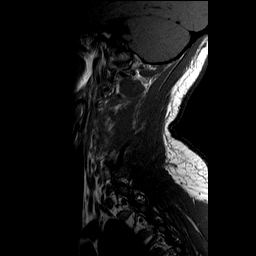
[im 3/15]
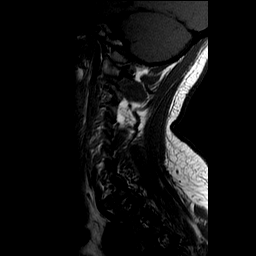
[im 6/15]
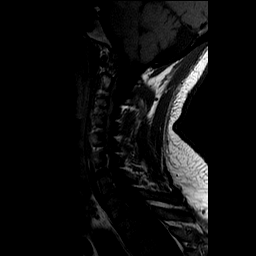
[im 9/15]
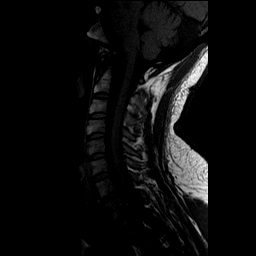
[im 12/15]
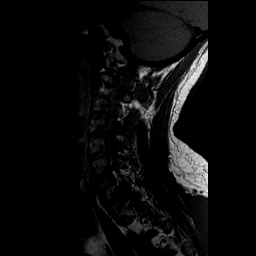
[im 15/15]
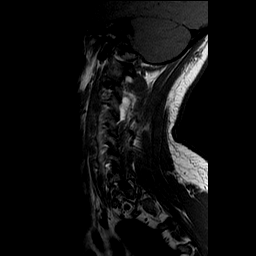

[Series 4: ir_sag · sagittal · 3.0mm · 0.90mm/px · 6 of 15 slices shown]
[im 1/15]
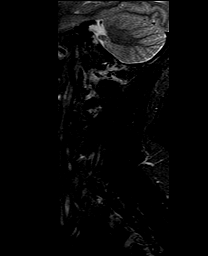
[im 3/15]
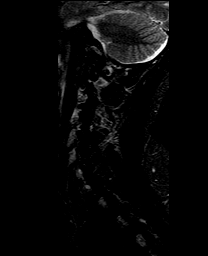
[im 6/15]
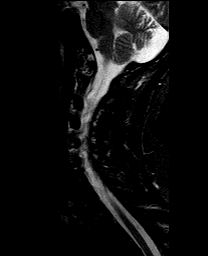
[im 9/15]
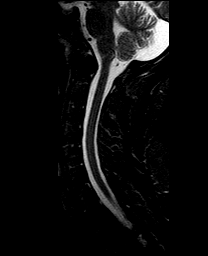
[im 12/15]
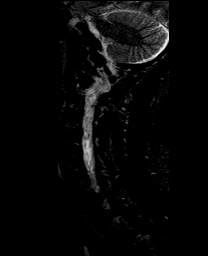
[im 15/15]
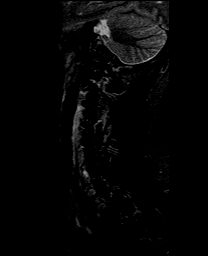

[Series 5: t2_medic_axial · axial · 3.0mm · 0.35mm/px · z∈[-75,+31]mm · 8 of 28 slices shown]
[im 1/28]
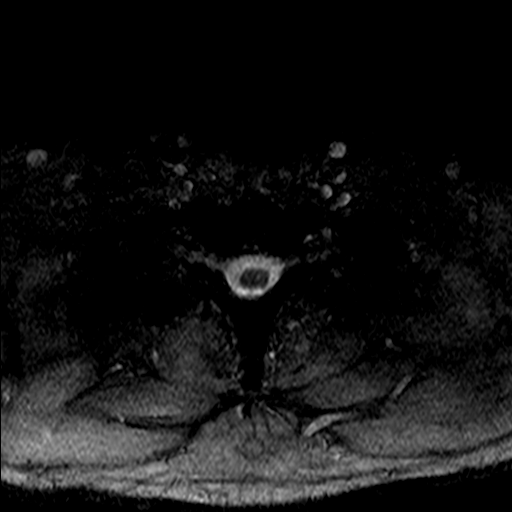
[im 5/28]
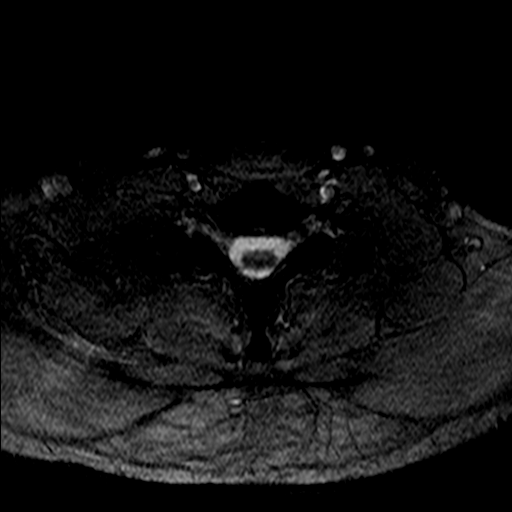
[im 8/28]
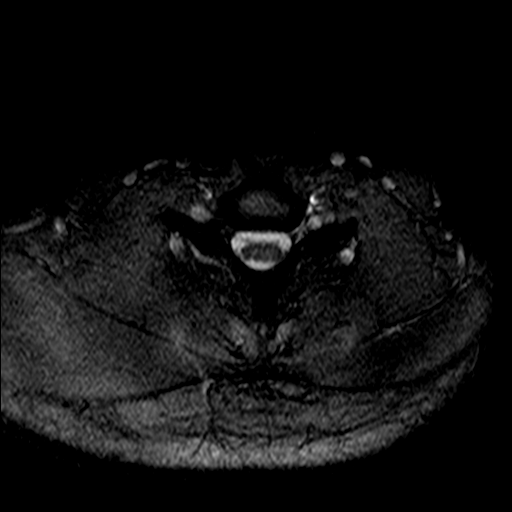
[im 13/28]
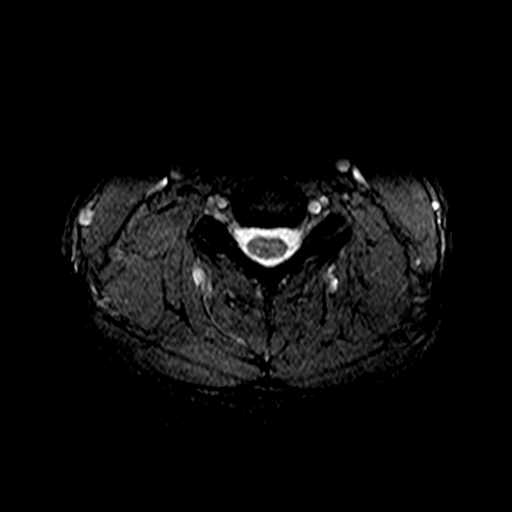
[im 15/28]
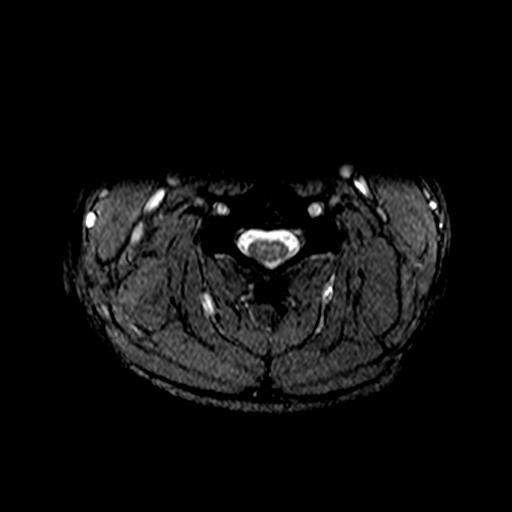
[im 20/28]
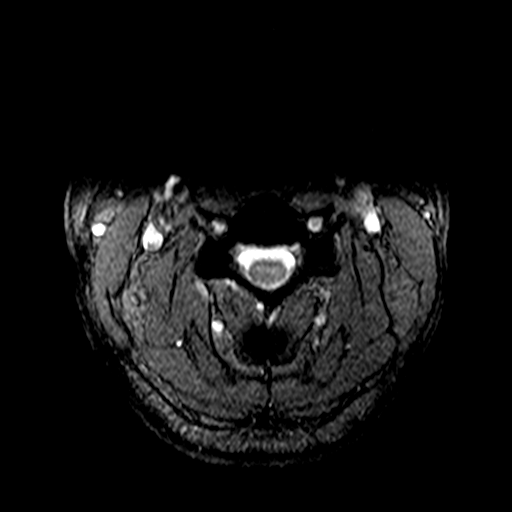
[im 23/28]
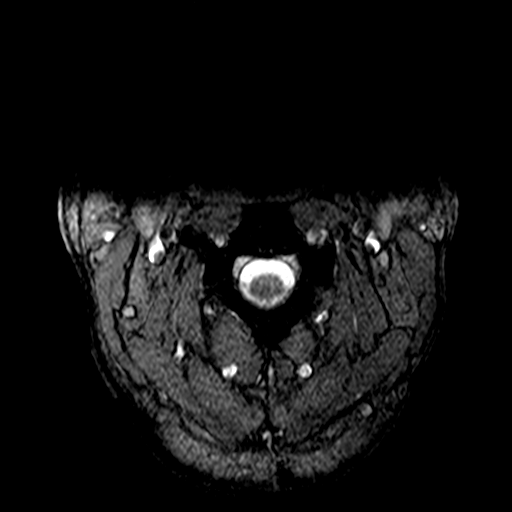
[im 28/28]
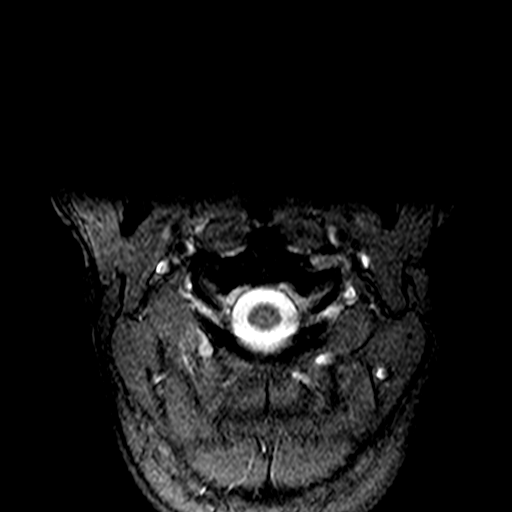

[Series 6: t2_axial · axial · 3.0mm · 0.56mm/px · z∈[-75,+31]mm · 8 of 28 slices shown]
[im 1/28]
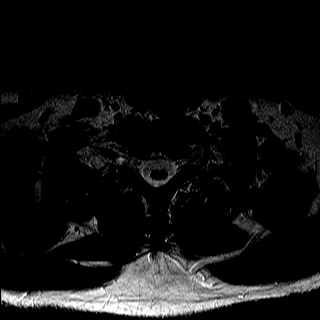
[im 5/28]
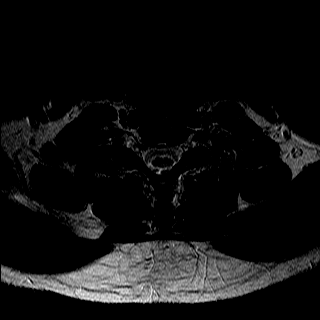
[im 8/28]
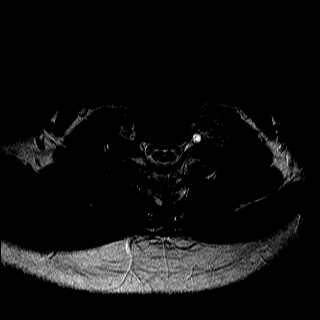
[im 13/28]
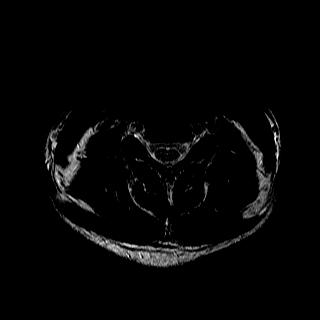
[im 15/28]
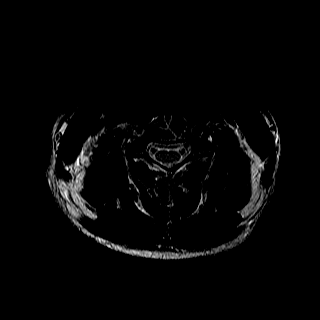
[im 20/28]
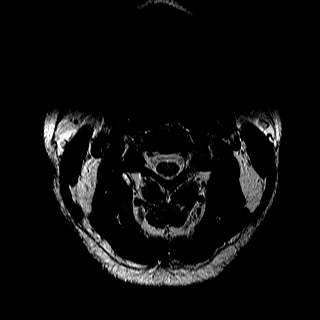
[im 23/28]
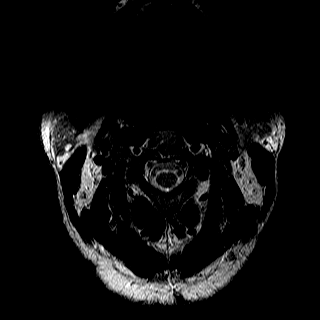
[im 28/28]
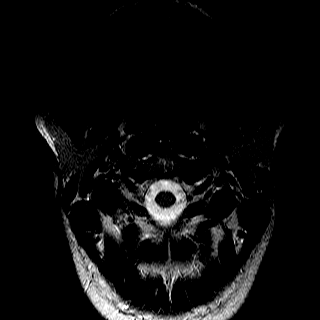

[40 of 48 positions shown; findings below may reference images not displayed]

FINDINGS: Posterior fossa:  The visualized posterior fossa is normal in appearance.
Cervical spinal cord:  The cervical spinal cord has normal signal intensity.
Bone marrow:  Regional bone marrow is normal in appearance.
Alignment: Mild lumbar levoscoliosis. No listhesis.
C2-C3: No disc protrusion, canal narrowing, or foraminal narrowing.
C3-C4: No disc protrusion, canal narrowing, or foraminal narrowing.
C4-C5: No disc protrusion, canal narrowing, or foraminal narrowing.
C5-C6: No disc protrusion, canal narrowing, or foraminal narrowing.
C6-C7: No disc protrusion, canal narrowing, or foraminal narrowing.
C7-T1: No disc protrusion, canal narrowing, or foraminal narrowing.
IMPRESSION: 1.  Mild cervical levoscoliosis.
2.  No disc protrusion, canal stenosis, or foraminal stenosis.

## 2022-09-29 IMAGING — CT CT THORAX WO CONTRAST
2 of 4 series · 13 of 36 positions shown, 16 images · non-contrast
Comparison: None
Coronary Artery Calcification: Absent

Images Obtained from [facility]
HISTORY: Moderate persistent asthma, uncomplicated
TECHNIQUE: Axial images of the chest were obtained from the lung apices through the lung bases without intravenous contrast. Dose reduction technique used: Automated exposure control and adjustment
of the mA and/or kV according to patient size. CT Studies and Cardiac Nuclear Medicine Studies in last 12-months = 0

[Series 2: soft tissue · axial · 0.59mm/px · z∈[+1561,+1836]mm · 10 of 65 slices shown, 13 images]
[im 5/65  mediastinal]
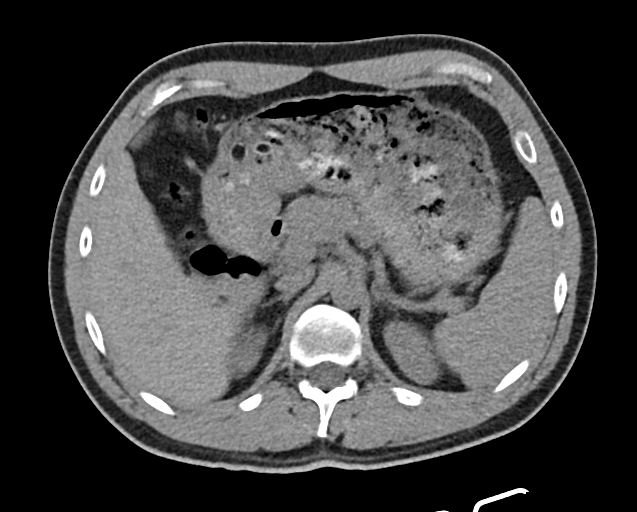
[im 5/65  lung]
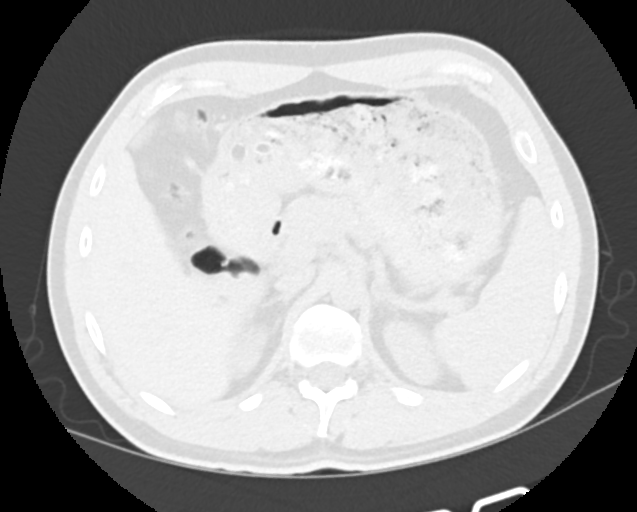
[im 10/65  lung]
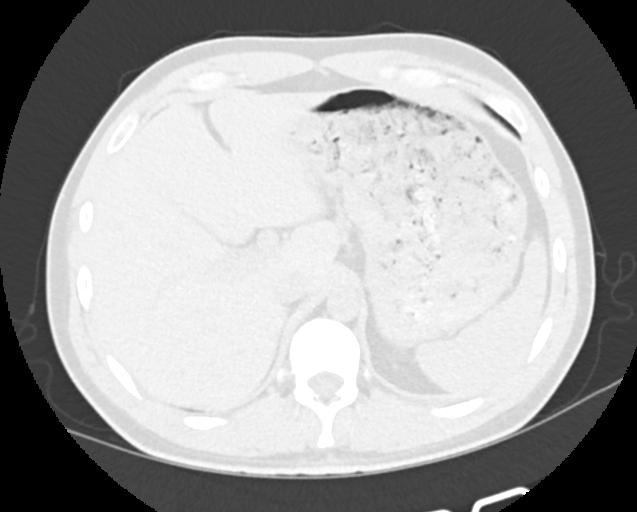
[im 18/65  lung]
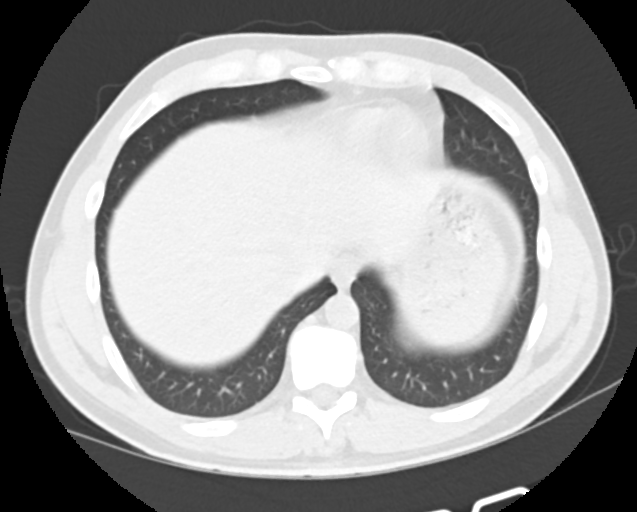
[im 23/65  lung]
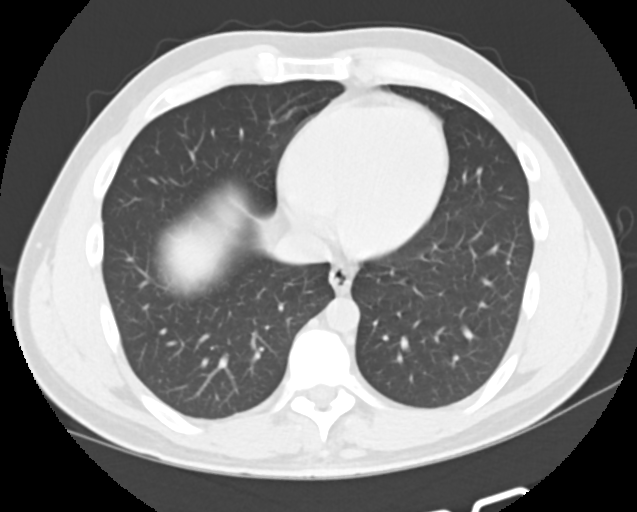
[im 30/65  mediastinal]
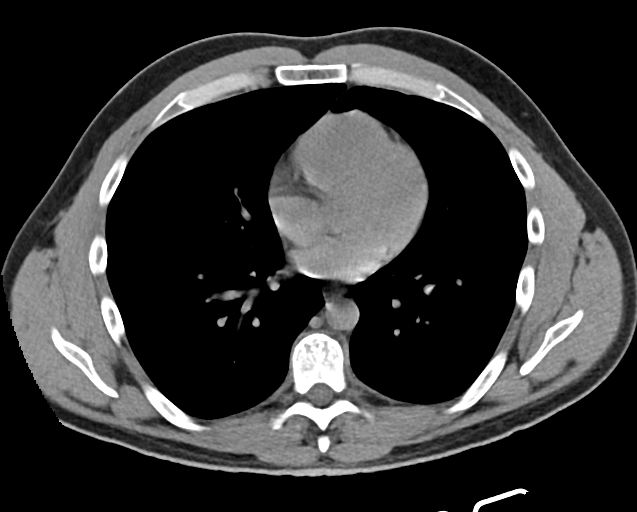
[im 30/65  lung]
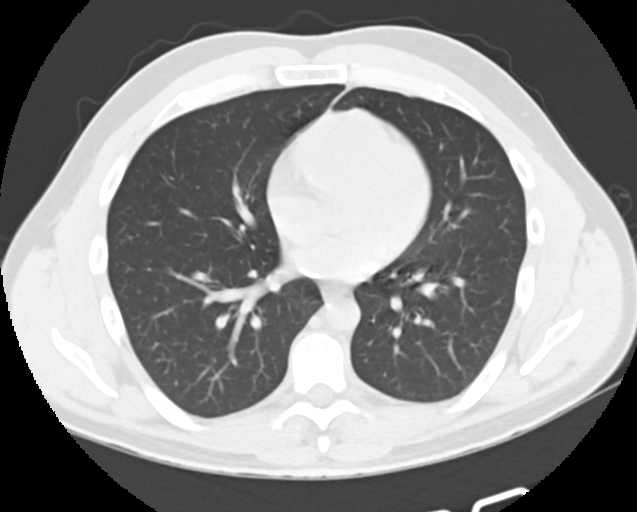
[im 35/65  lung]
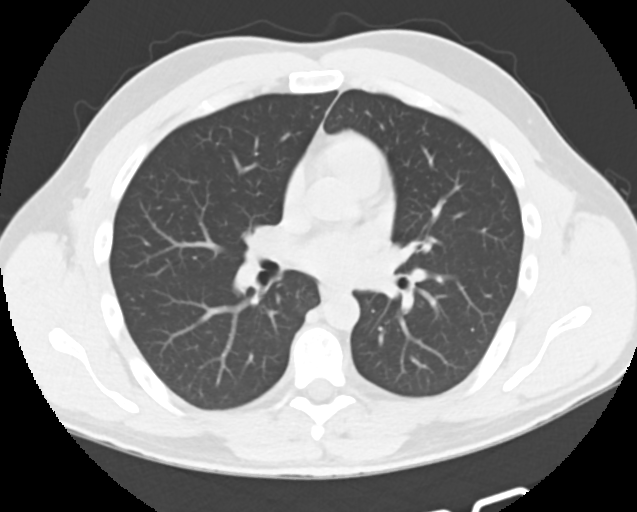
[im 42/65  lung]
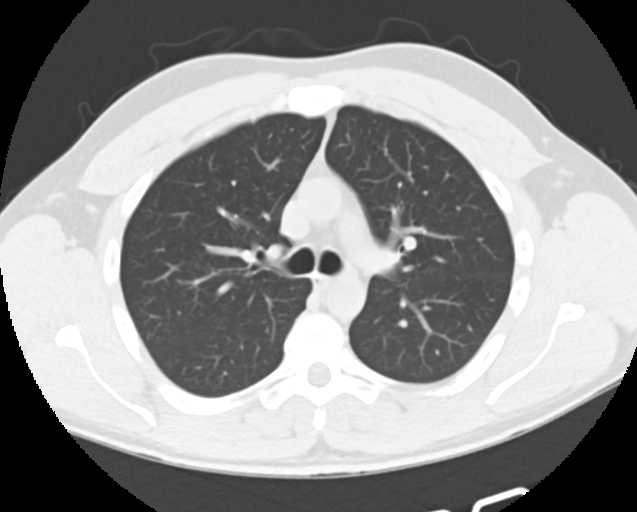
[im 47/65  lung]
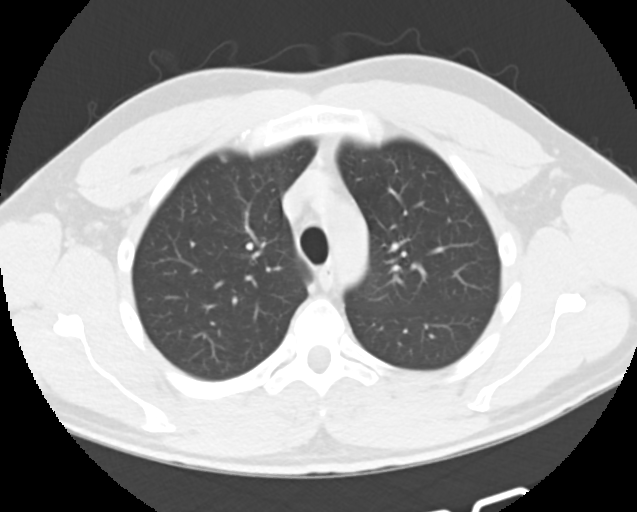
[im 55/65  mediastinal]
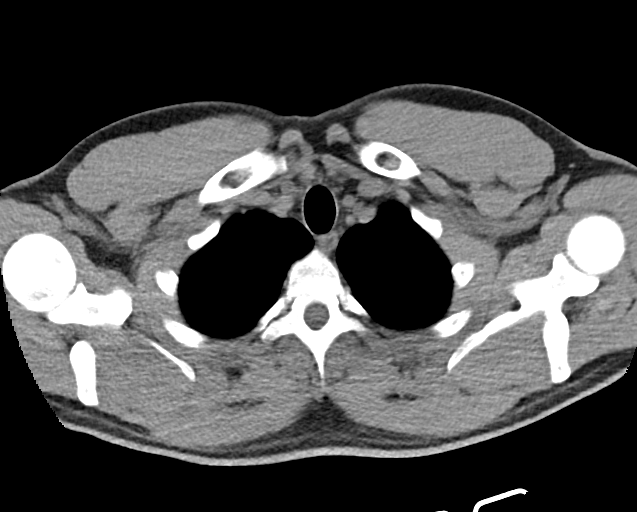
[im 55/65  lung]
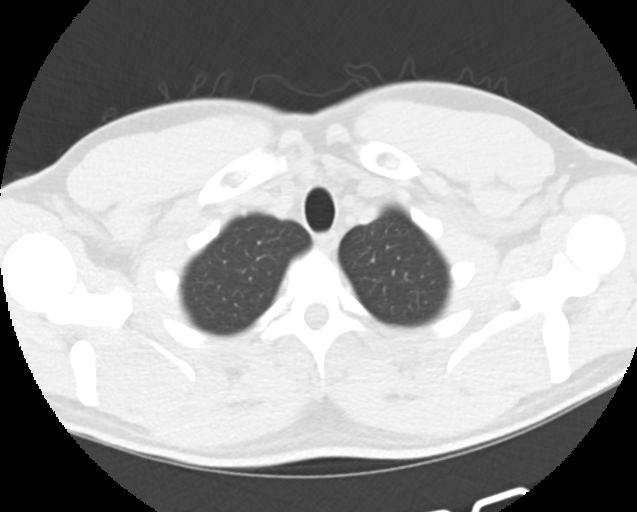
[im 60/65  lung]
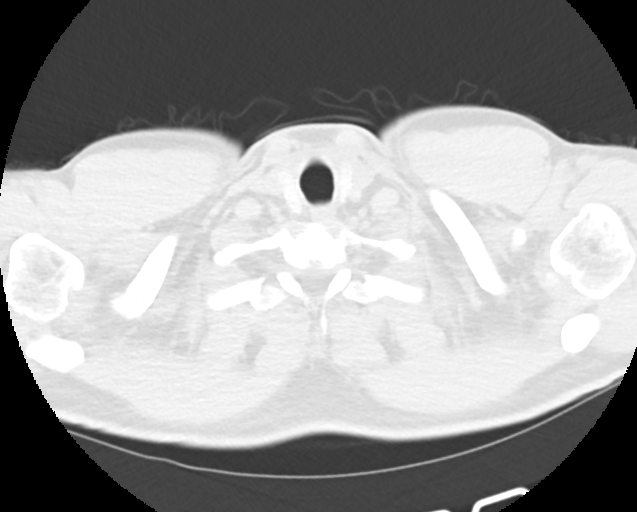

[Series 4: coronal · coronal · 0.64mm/px · 3 of 60 slices shown]
[im 12/60  lung]
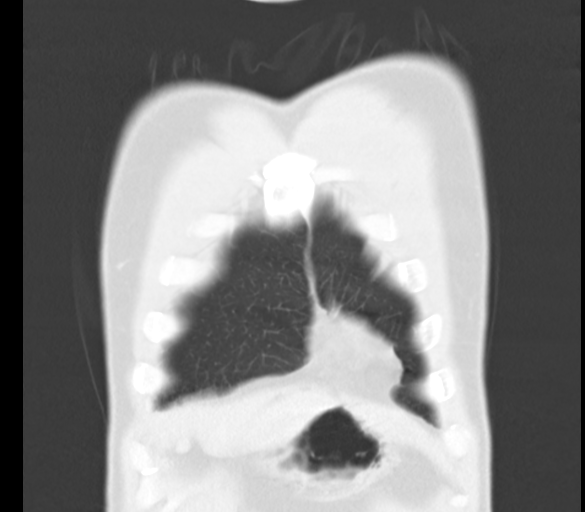
[im 24/60  lung]
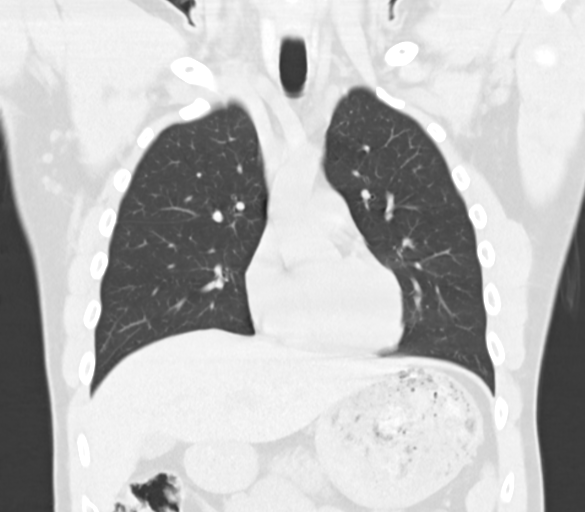
[im 36/60  lung]
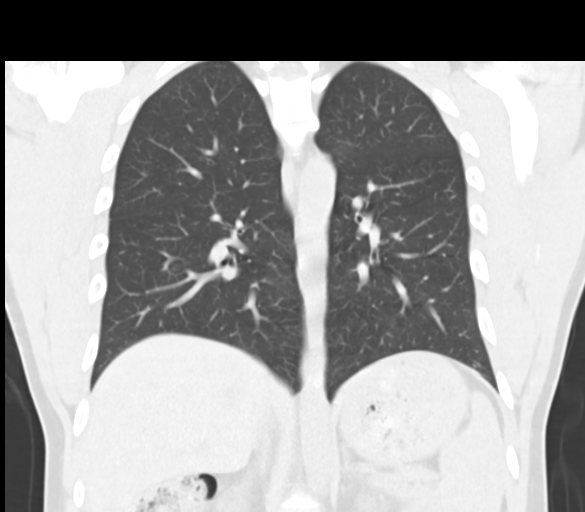

[13 of 36 positions shown; findings below may reference images not displayed]

FINDINGS: The central airways are patent.
Lung fields demonstrate tiny 3 mm nodular density within the lingula, series 8 image 112. No focal infiltrates.
There are no pleural effusions.
The heart is normal in size without significant pericardial effusion.
There is no mediastinal, hilar, or axillary lymphadenopathy. [IV contrast reduces ability to detect adenopathy in these positions.
Limited evaluation of the upper abdomen is unremarkable.
The osseous structures are unremarkable.
IMPRESSION: 1.  Tiny 3 mm nodular density within the lingula. Follow-up in 6-12 months with low-dose chest CT
2.  No focal infiltrates. No pleural fluid. No pneumothorax.
3.  No mediastinal, hilar, or axillary lymphadenopathy. IV contrast reduces ability to detect adenopathy in these positions.
Total radiation dose to patient is CTDIvol 3.30 mGy and DLP 122.00 mGy-cm.
Total radiation dose to patient is CTDIvol 3.30 mGy and DLP 122.00 mGy-cm.

## 2023-01-31 IMAGING — CR HAND LT 3 VWS MIN
1 series · 3 of 3 positions shown · non-contrast
Comparison: None

Images Obtained from [facility]
Left hand radiographs, 3 views
INDICATION: pain in left hand

[Series 1: lat · 0.17mm/px · 3 of 3 slices shown]
[im 1/3]
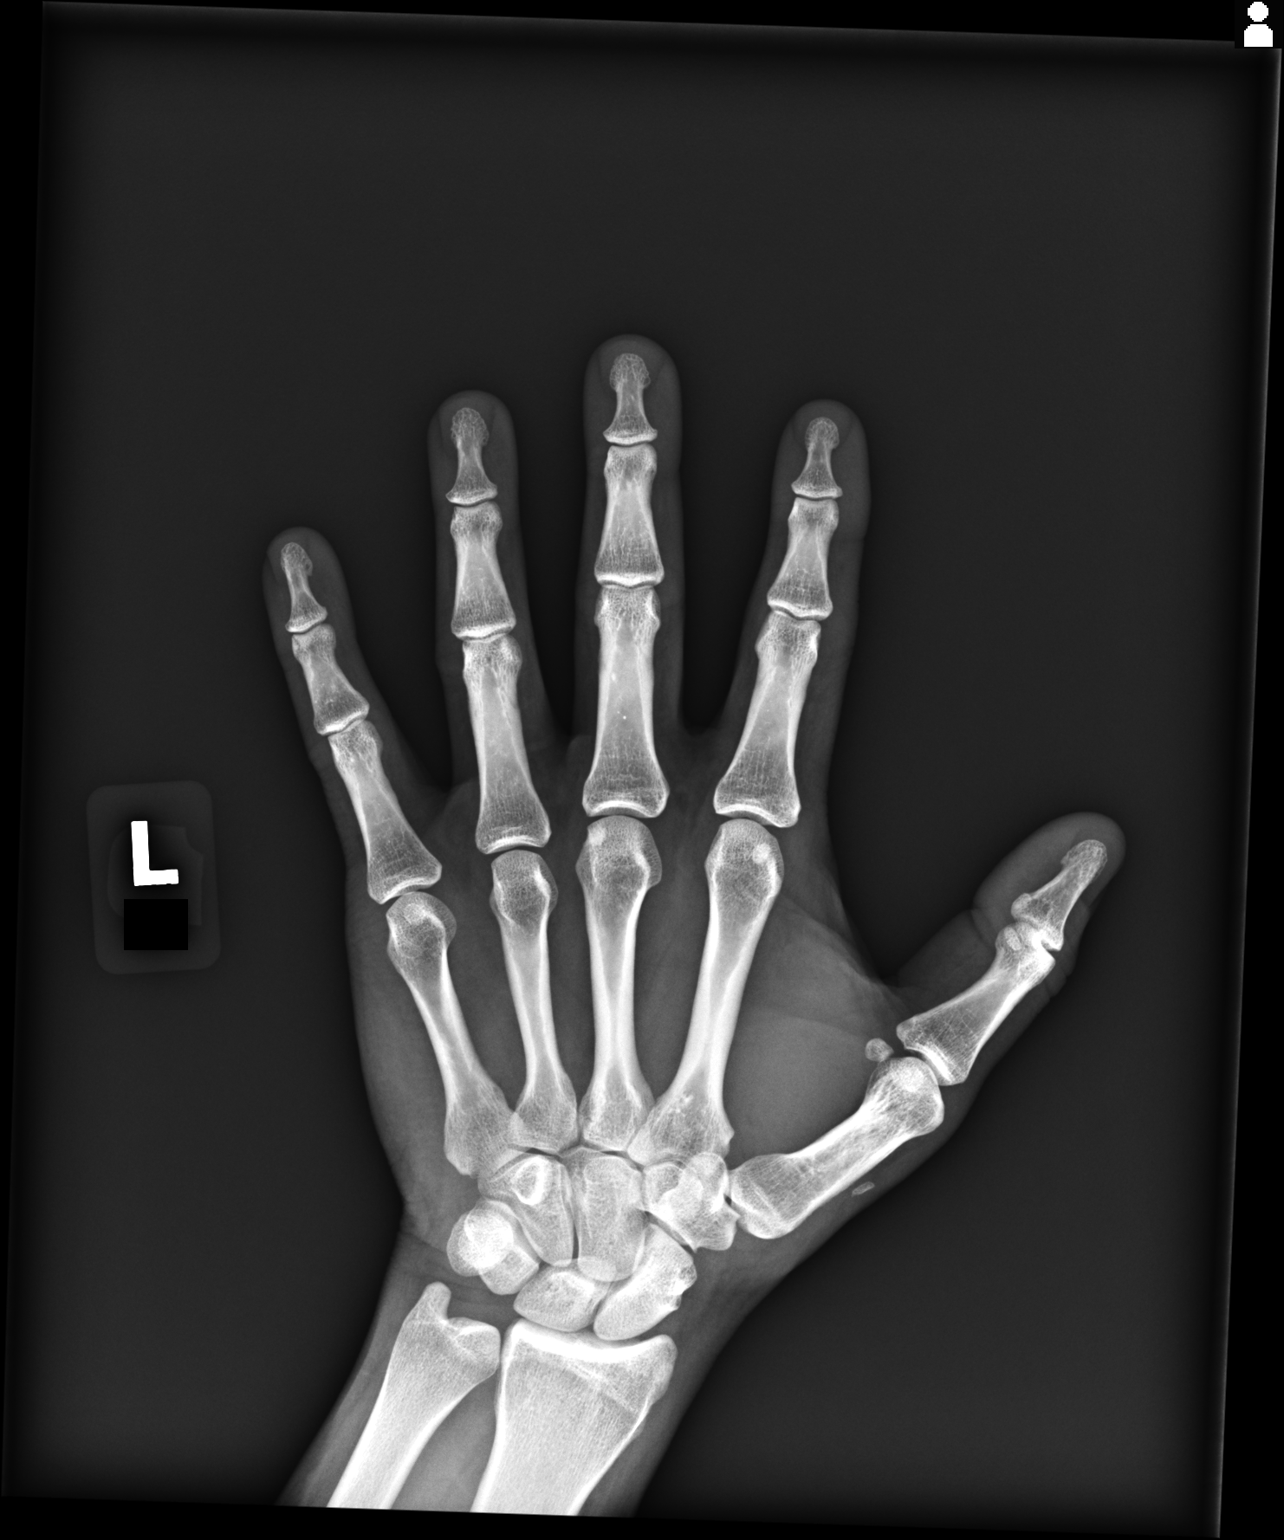
[im 2/3]
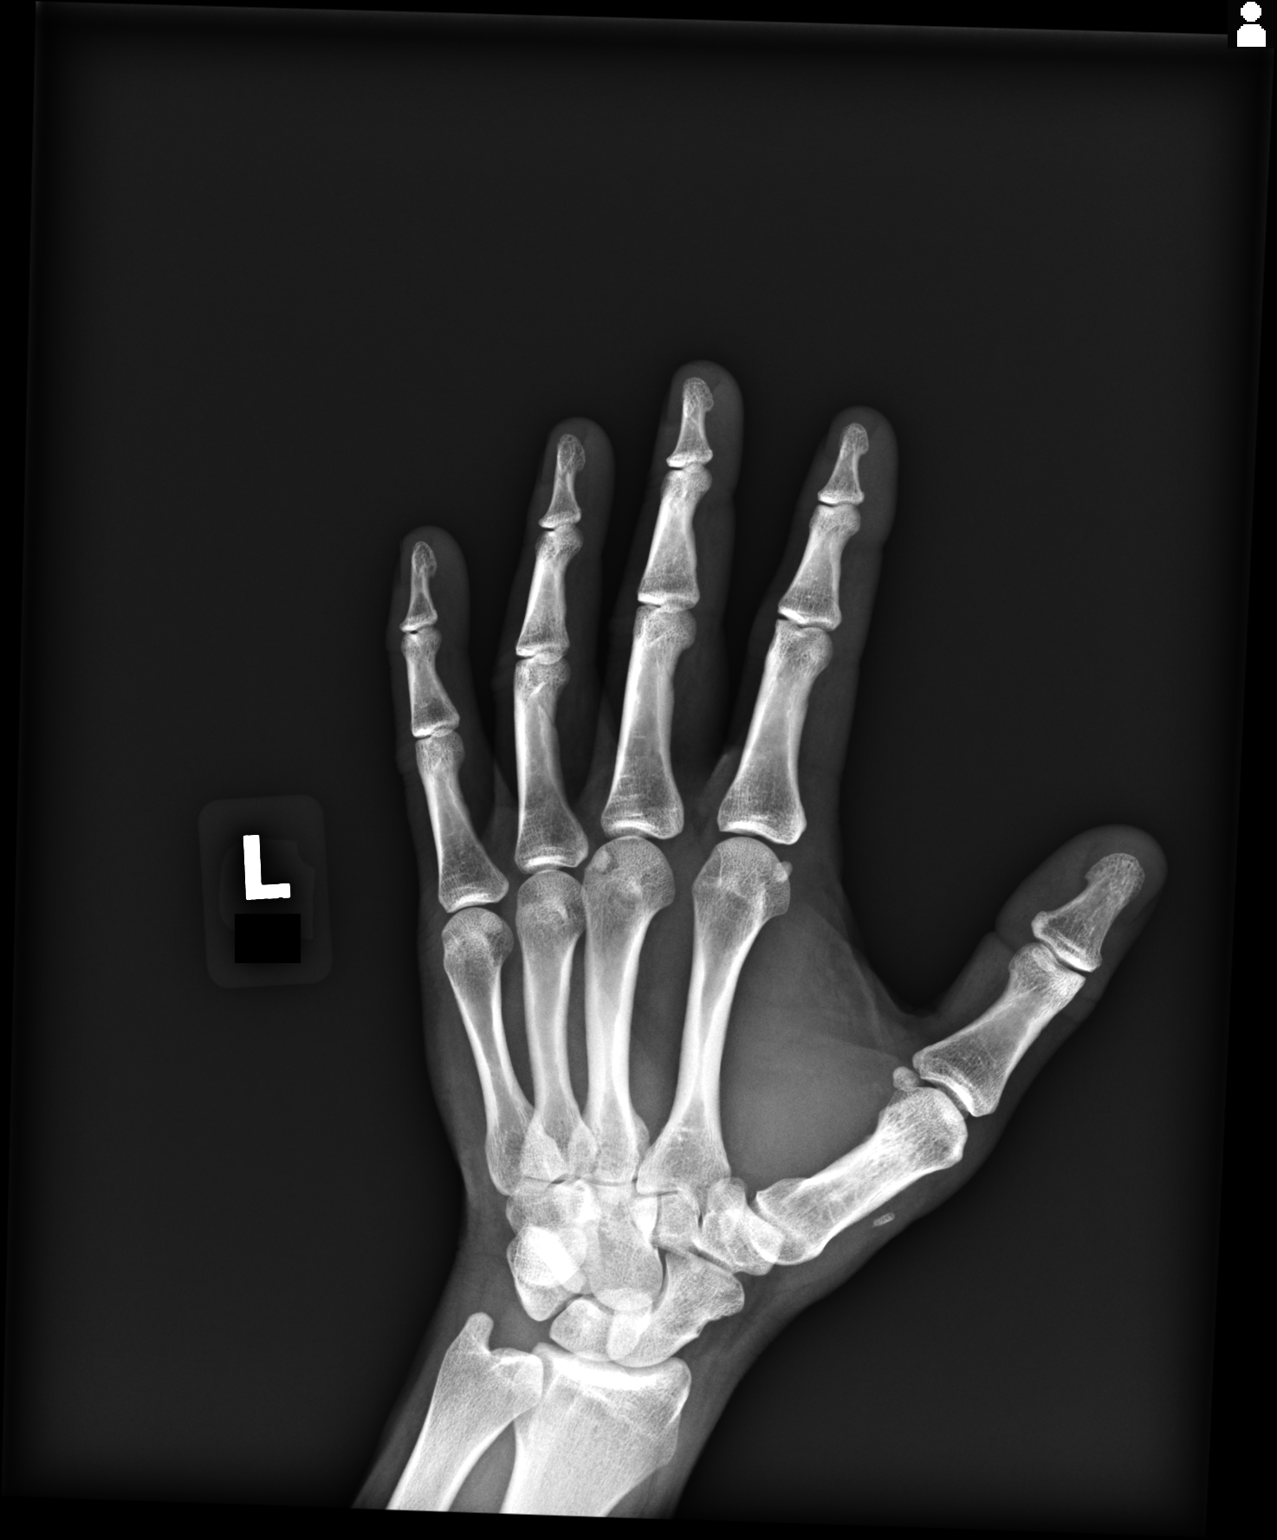
[im 3/3]
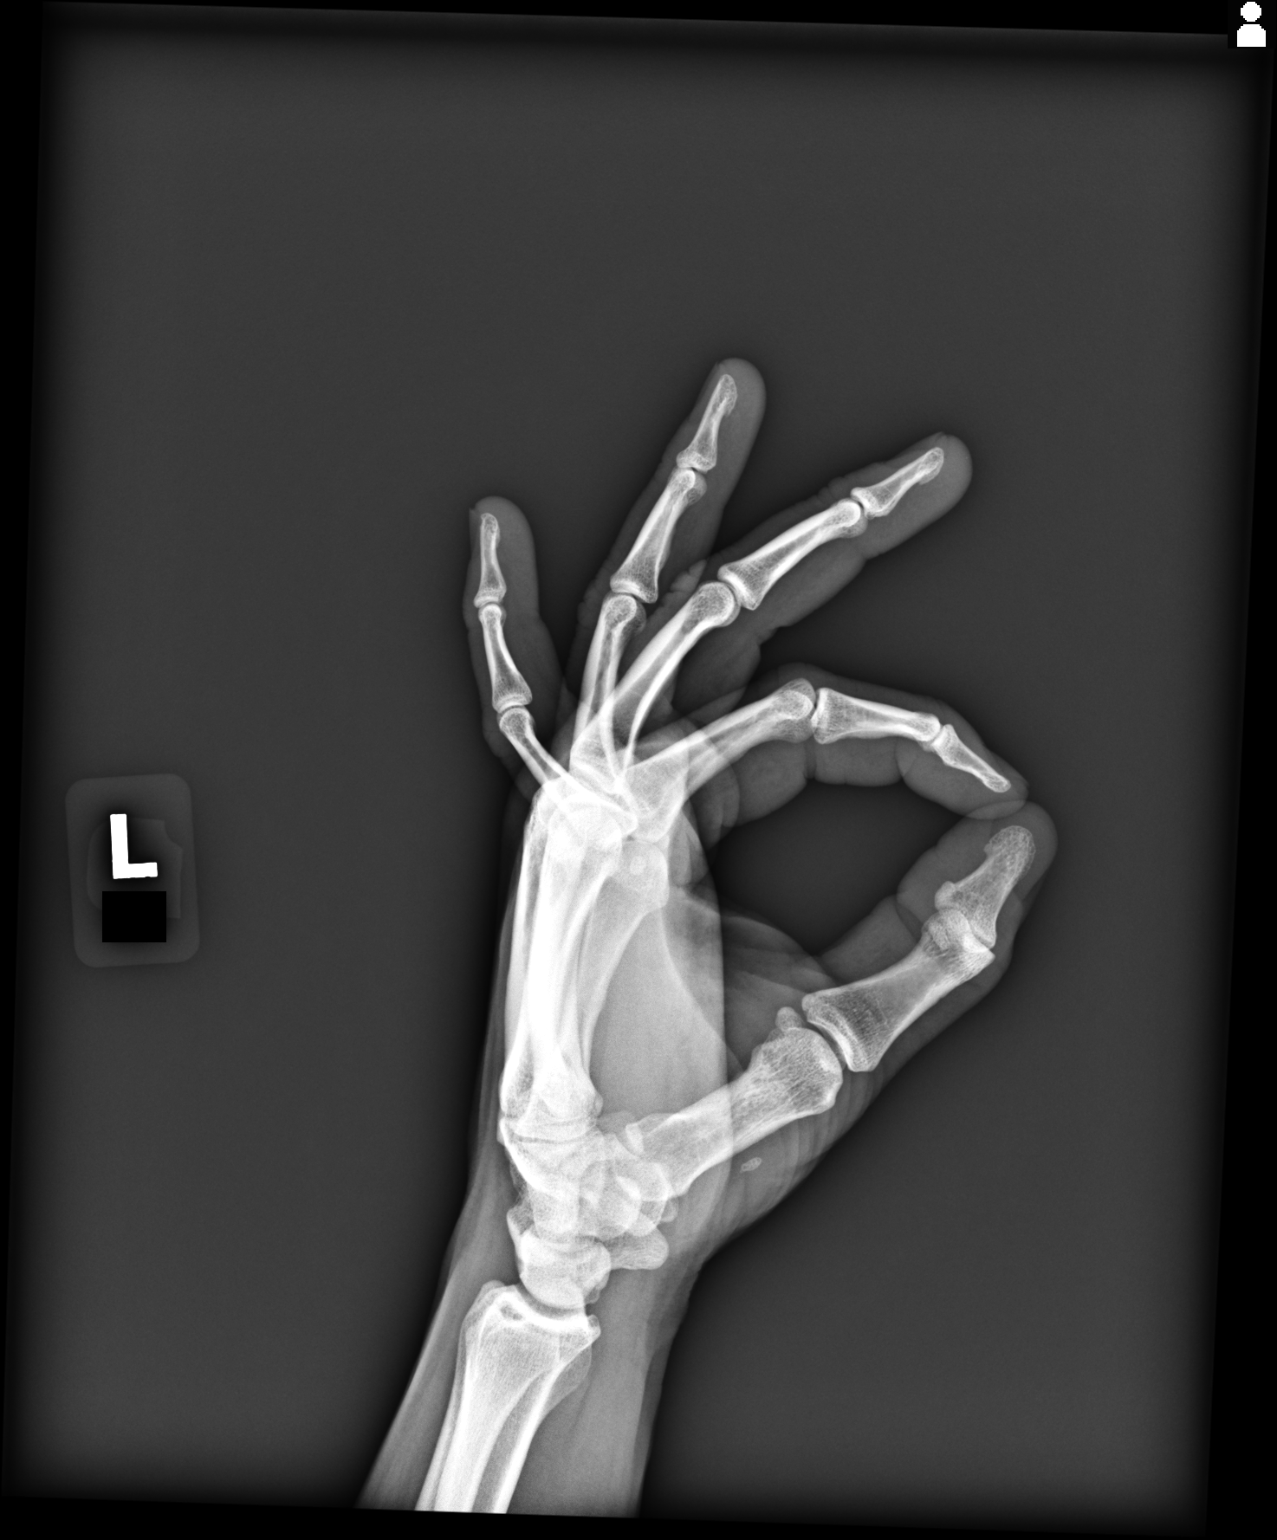

[3 of 3 positions shown; findings below may reference images not displayed]

FINDINGS: No acute fracture. No dislocation. Joint spaces are intact. No erosion.
IMPRESSION: Unremarkable left hand radiographs.

## 2023-01-31 IMAGING — CR HAND RT 3 VWS MIN
1 series · 3 of 3 positions shown · non-contrast
Comparison: None

Images Obtained from [facility]
Right hand radiographs, 3 views
INDICATION: pain in right hand

[Series 1: lat · 0.17mm/px · 3 of 3 slices shown]
[im 1/3]
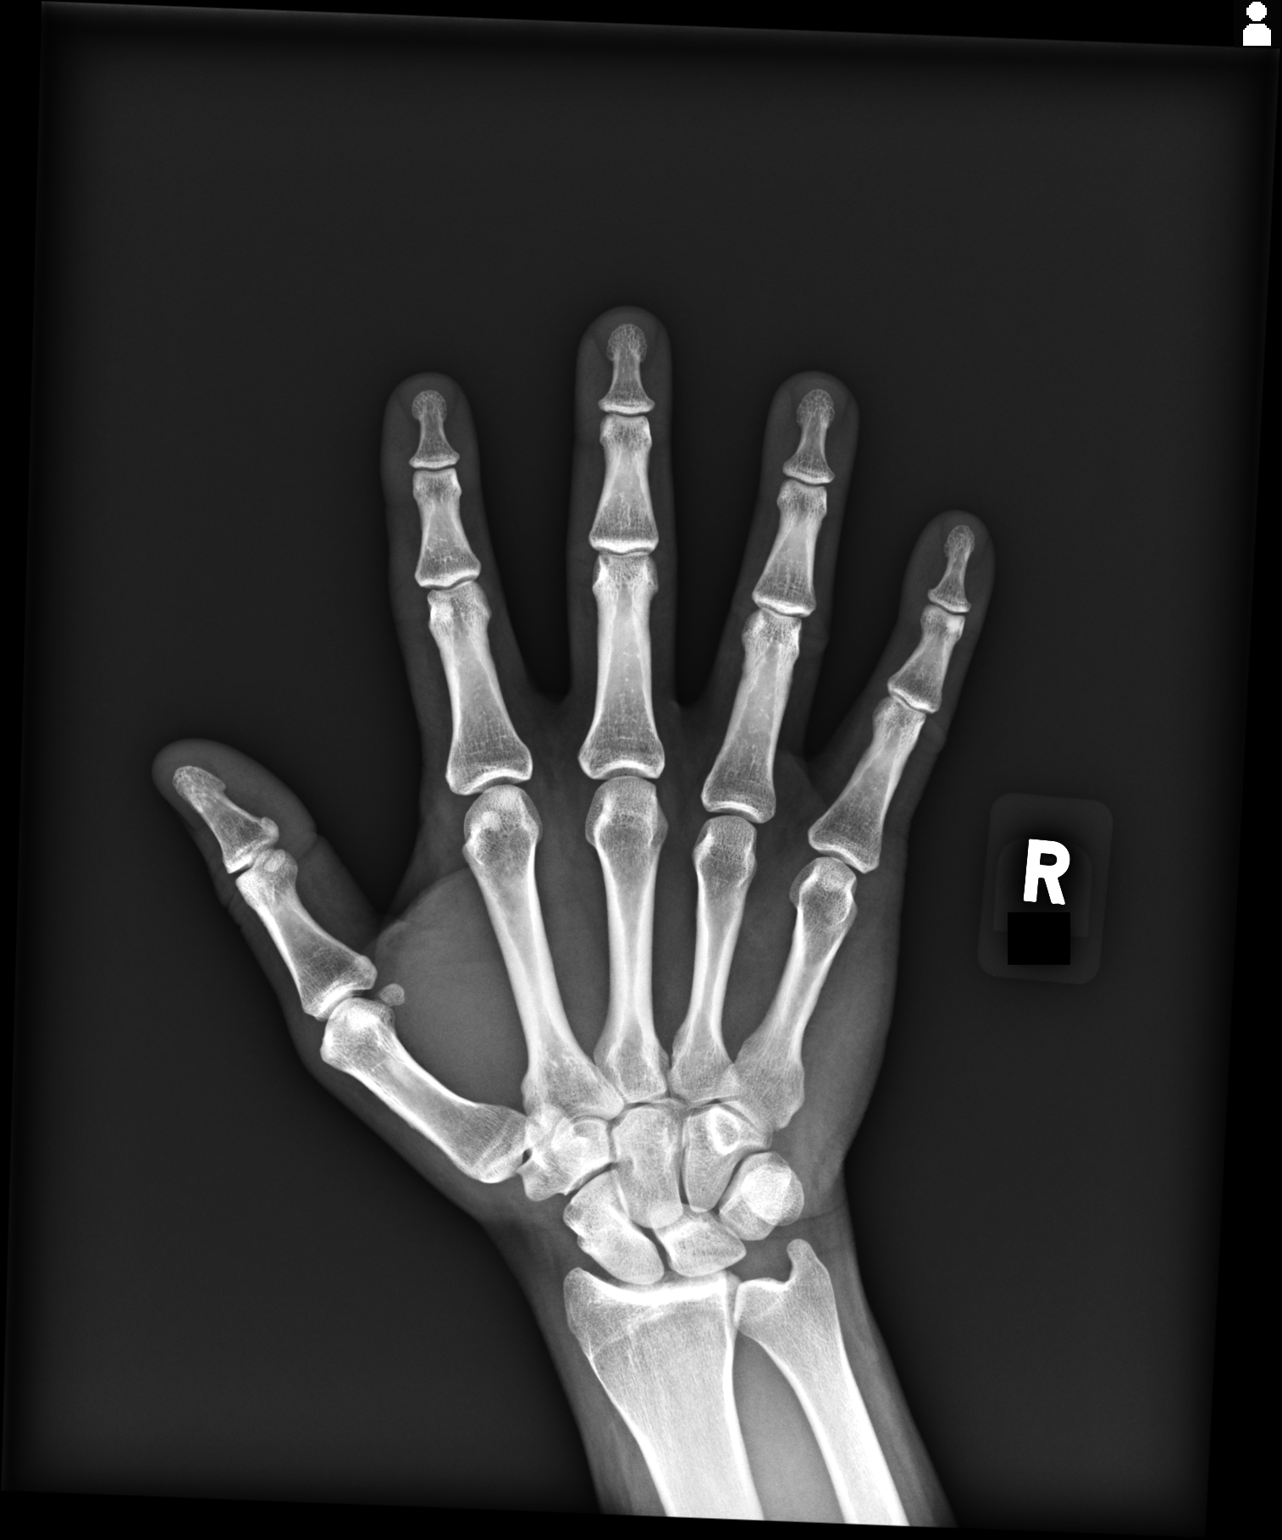
[im 2/3]
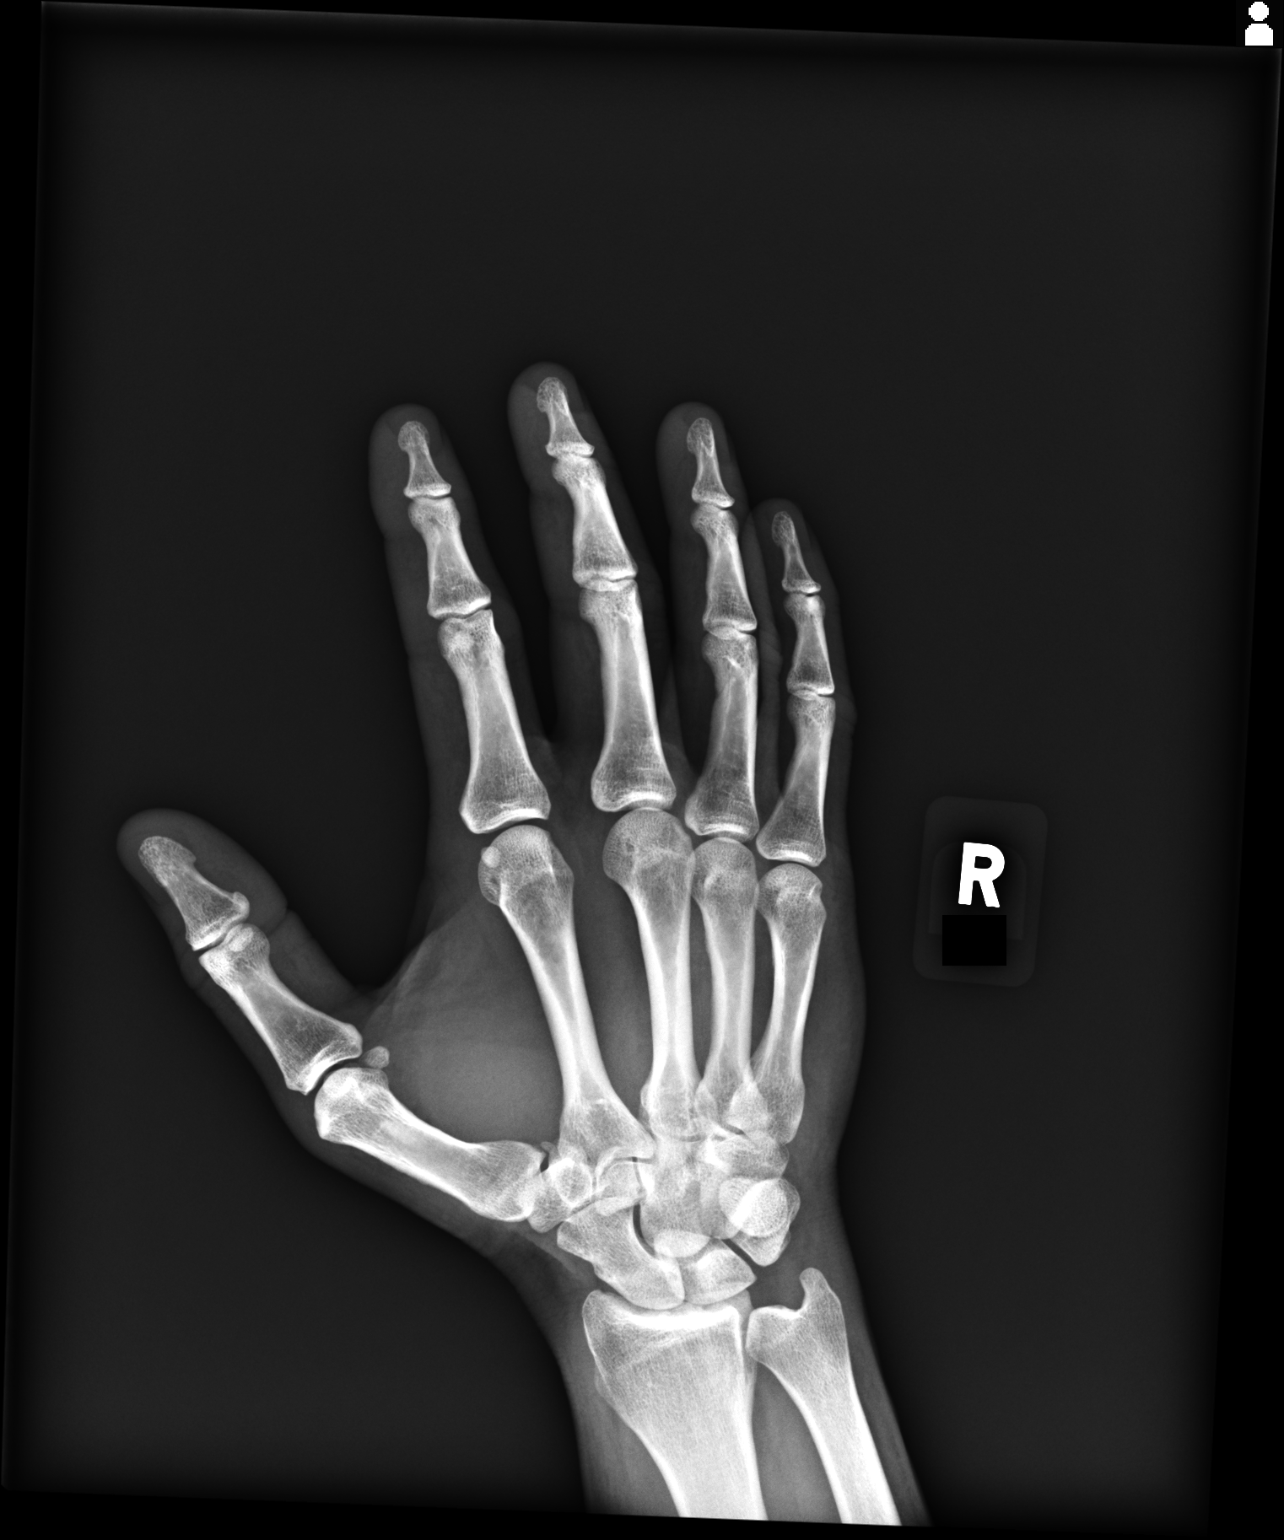
[im 3/3]
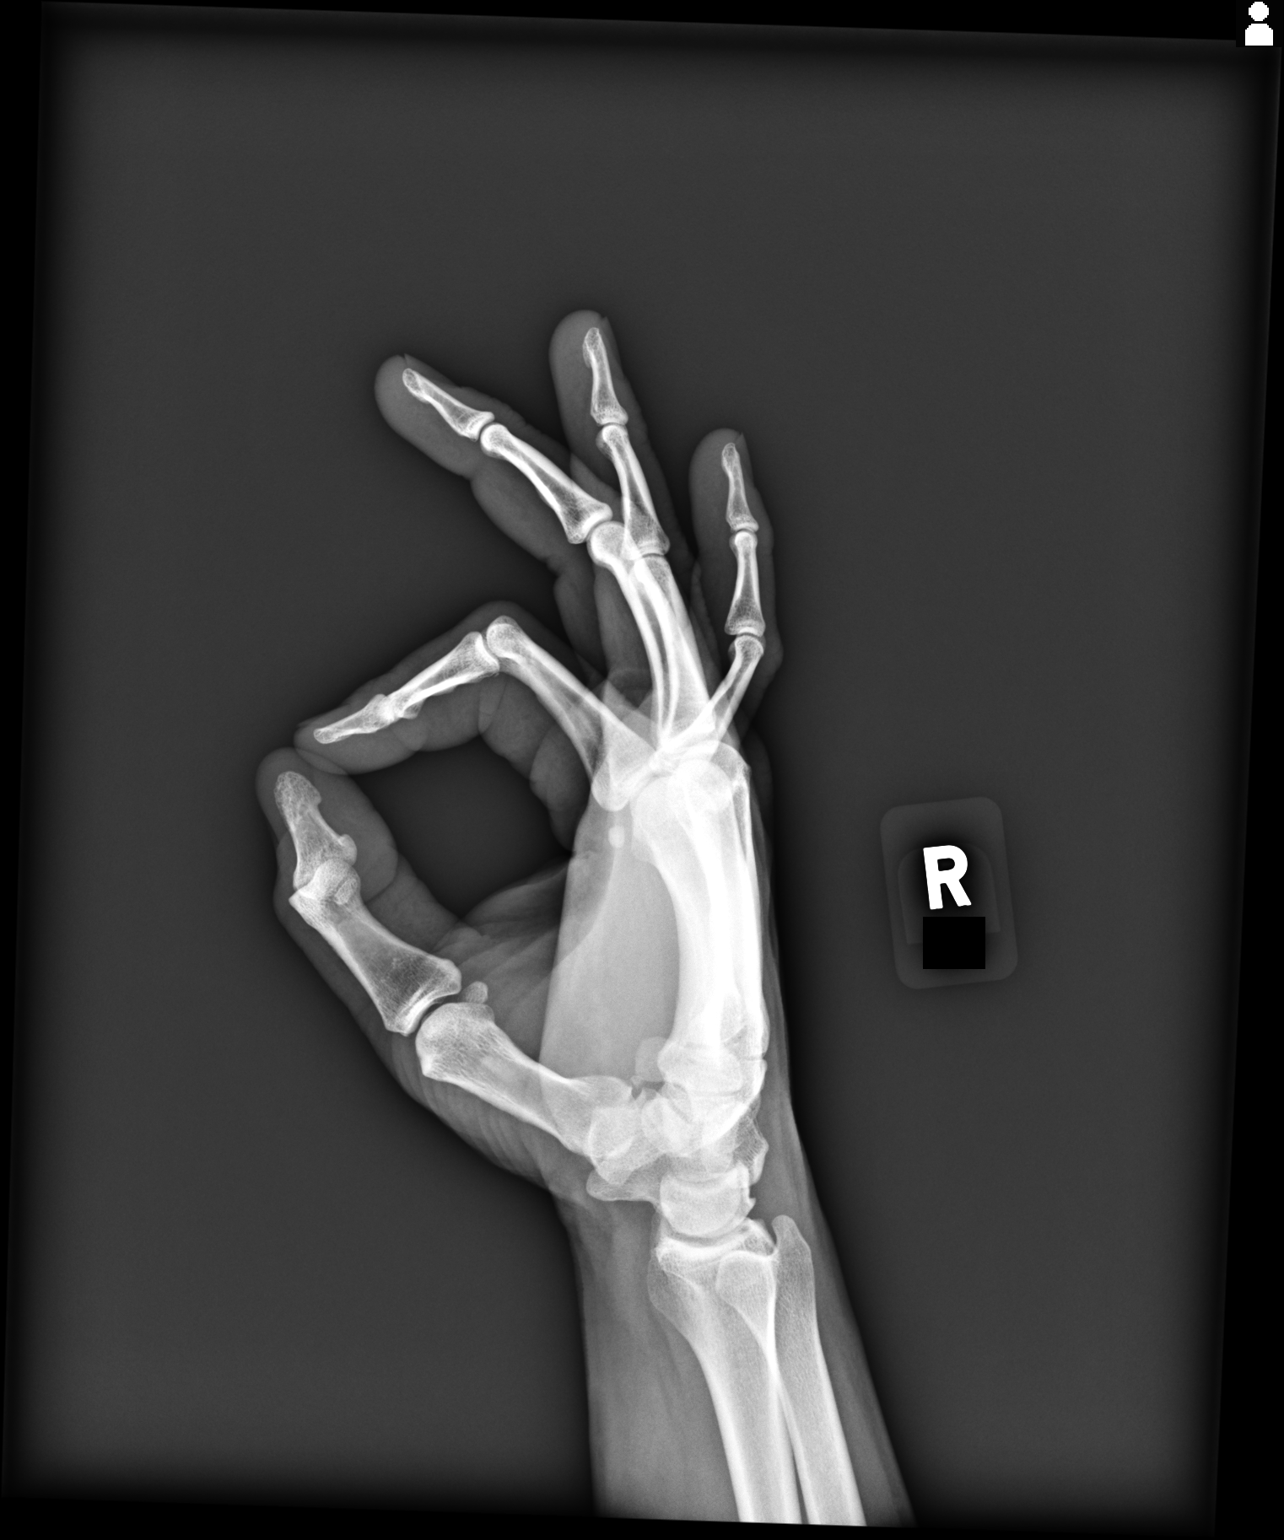

[3 of 3 positions shown; findings below may reference images not displayed]

FINDINGS: No acute fracture. No dislocation. Joint spaces are intact. No erosion.
IMPRESSION: Unremarkable right hand radiographs.
